# Patient Record
Sex: Female | Born: 1945 | Race: White | Hispanic: No | Smoking: Former smoker
Health system: Southern US, Community
[De-identification: ages and names within clinical notes are randomized; demographics above are authoritative.]

## PROBLEM LIST (undated history)

## (undated) DIAGNOSIS — T4145XA Adverse effect of unspecified anesthetic, initial encounter: Secondary | ICD-10-CM

## (undated) DIAGNOSIS — M1990 Unspecified osteoarthritis, unspecified site: Secondary | ICD-10-CM

## (undated) DIAGNOSIS — F419 Anxiety disorder, unspecified: Secondary | ICD-10-CM

## (undated) DIAGNOSIS — K219 Gastro-esophageal reflux disease without esophagitis: Secondary | ICD-10-CM

## (undated) DIAGNOSIS — T8859XA Other complications of anesthesia, initial encounter: Secondary | ICD-10-CM

## (undated) DIAGNOSIS — E785 Hyperlipidemia, unspecified: Secondary | ICD-10-CM

## (undated) DIAGNOSIS — E039 Hypothyroidism, unspecified: Secondary | ICD-10-CM

## (undated) DIAGNOSIS — C801 Malignant (primary) neoplasm, unspecified: Secondary | ICD-10-CM

## (undated) HISTORY — PX: MASTECTOMY: SHX3

## (undated) HISTORY — PX: APPENDECTOMY: SHX54

## (undated) HISTORY — PX: OTHER SURGICAL HISTORY: SHX169

## (undated) HISTORY — PX: CHOLECYSTECTOMY: SHX55

## (undated) HISTORY — PX: CARDIAC CATHETERIZATION: SHX172

## (undated) HISTORY — PX: ENDOMETRIAL BIOPSY: SHX622

## (undated) HISTORY — PX: NASAL SINUS SURGERY: SHX719

## (undated) HISTORY — PX: EYE SURGERY: SHX253

---

## 1998-01-31 ENCOUNTER — Ambulatory Visit (HOSPITAL_COMMUNITY): Admission: RE | Admit: 1998-01-31 | Discharge: 1998-01-31 | Payer: Self-pay | Admitting: *Deleted

## 1998-03-18 ENCOUNTER — Ambulatory Visit (HOSPITAL_COMMUNITY): Admission: RE | Admit: 1998-03-18 | Discharge: 1998-03-18 | Payer: Self-pay | Admitting: Neurosurgery

## 1998-04-02 ENCOUNTER — Ambulatory Visit (HOSPITAL_COMMUNITY): Admission: RE | Admit: 1998-04-02 | Discharge: 1998-04-02 | Payer: Self-pay | Admitting: Neurosurgery

## 1998-04-16 ENCOUNTER — Ambulatory Visit (HOSPITAL_COMMUNITY): Admission: RE | Admit: 1998-04-16 | Discharge: 1998-04-16 | Payer: Self-pay | Admitting: Neurosurgery

## 1998-06-04 ENCOUNTER — Other Ambulatory Visit: Admission: RE | Admit: 1998-06-04 | Discharge: 1998-06-04 | Payer: Self-pay | Admitting: Oncology

## 2000-06-04 ENCOUNTER — Encounter: Admission: RE | Admit: 2000-06-04 | Discharge: 2000-06-04 | Payer: Self-pay | Admitting: Oncology

## 2000-06-04 ENCOUNTER — Encounter: Payer: Self-pay | Admitting: Oncology

## 2000-06-14 ENCOUNTER — Encounter: Admission: RE | Admit: 2000-06-14 | Discharge: 2000-06-14 | Payer: Self-pay | Admitting: Oncology

## 2000-06-14 ENCOUNTER — Encounter: Payer: Self-pay | Admitting: Oncology

## 2000-12-21 ENCOUNTER — Encounter: Payer: Self-pay | Admitting: Gastroenterology

## 2000-12-21 ENCOUNTER — Ambulatory Visit (HOSPITAL_COMMUNITY): Admission: RE | Admit: 2000-12-21 | Discharge: 2000-12-21 | Payer: Self-pay | Admitting: Gastroenterology

## 2001-06-06 ENCOUNTER — Encounter: Admission: RE | Admit: 2001-06-06 | Discharge: 2001-06-06 | Payer: Self-pay | Admitting: Oncology

## 2001-06-06 ENCOUNTER — Encounter: Payer: Self-pay | Admitting: Oncology

## 2001-06-07 ENCOUNTER — Other Ambulatory Visit: Admission: RE | Admit: 2001-06-07 | Discharge: 2001-06-07 | Payer: Self-pay | Admitting: Oncology

## 2002-06-09 ENCOUNTER — Encounter: Payer: Self-pay | Admitting: Oncology

## 2002-06-09 ENCOUNTER — Encounter: Admission: RE | Admit: 2002-06-09 | Discharge: 2002-06-09 | Payer: Self-pay | Admitting: Oncology

## 2002-06-14 ENCOUNTER — Ambulatory Visit (HOSPITAL_COMMUNITY): Admission: RE | Admit: 2002-06-14 | Discharge: 2002-06-14 | Payer: Self-pay | Admitting: Oncology

## 2002-06-14 ENCOUNTER — Encounter: Payer: Self-pay | Admitting: Oncology

## 2002-10-13 ENCOUNTER — Encounter: Payer: Self-pay | Admitting: Gastroenterology

## 2002-10-13 ENCOUNTER — Ambulatory Visit (HOSPITAL_COMMUNITY): Admission: RE | Admit: 2002-10-13 | Discharge: 2002-10-13 | Payer: Self-pay | Admitting: Gastroenterology

## 2003-02-20 ENCOUNTER — Encounter: Payer: Self-pay | Admitting: Neurosurgery

## 2003-02-20 ENCOUNTER — Ambulatory Visit (HOSPITAL_COMMUNITY): Admission: RE | Admit: 2003-02-20 | Discharge: 2003-02-20 | Payer: Self-pay | Admitting: Neurosurgery

## 2003-06-11 ENCOUNTER — Encounter: Admission: RE | Admit: 2003-06-11 | Discharge: 2003-06-11 | Payer: Self-pay | Admitting: Oncology

## 2003-06-11 ENCOUNTER — Encounter: Payer: Self-pay | Admitting: Oncology

## 2003-06-16 ENCOUNTER — Encounter: Payer: Self-pay | Admitting: Oncology

## 2003-06-16 ENCOUNTER — Ambulatory Visit (HOSPITAL_COMMUNITY): Admission: RE | Admit: 2003-06-16 | Discharge: 2003-06-16 | Payer: Self-pay | Admitting: Oncology

## 2003-06-19 ENCOUNTER — Encounter: Payer: Self-pay | Admitting: Neurosurgery

## 2003-06-19 ENCOUNTER — Ambulatory Visit (HOSPITAL_COMMUNITY): Admission: RE | Admit: 2003-06-19 | Discharge: 2003-06-19 | Payer: Self-pay | Admitting: Neurosurgery

## 2003-06-29 HISTORY — PX: BACK SURGERY: SHX140

## 2003-07-10 ENCOUNTER — Inpatient Hospital Stay (HOSPITAL_COMMUNITY): Admission: RE | Admit: 2003-07-10 | Discharge: 2003-07-11 | Payer: Self-pay | Admitting: Neurosurgery

## 2003-07-10 ENCOUNTER — Encounter: Payer: Self-pay | Admitting: Neurosurgery

## 2003-11-22 ENCOUNTER — Ambulatory Visit (HOSPITAL_COMMUNITY): Admission: RE | Admit: 2003-11-22 | Discharge: 2003-11-22 | Payer: Self-pay | Admitting: General Surgery

## 2003-12-14 ENCOUNTER — Ambulatory Visit (HOSPITAL_COMMUNITY): Admission: RE | Admit: 2003-12-14 | Discharge: 2003-12-14 | Payer: Self-pay | Admitting: General Surgery

## 2004-01-27 HISTORY — PX: DILATION AND CURETTAGE OF UTERUS: SHX78

## 2004-02-14 ENCOUNTER — Encounter (INDEPENDENT_AMBULATORY_CARE_PROVIDER_SITE_OTHER): Payer: Self-pay | Admitting: Specialist

## 2004-02-14 ENCOUNTER — Ambulatory Visit (HOSPITAL_COMMUNITY): Admission: RE | Admit: 2004-02-14 | Discharge: 2004-02-14 | Payer: Self-pay | Admitting: Obstetrics and Gynecology

## 2004-02-14 ENCOUNTER — Ambulatory Visit (HOSPITAL_BASED_OUTPATIENT_CLINIC_OR_DEPARTMENT_OTHER): Admission: RE | Admit: 2004-02-14 | Discharge: 2004-02-14 | Payer: Self-pay | Admitting: Obstetrics and Gynecology

## 2004-06-11 ENCOUNTER — Encounter: Admission: RE | Admit: 2004-06-11 | Discharge: 2004-06-11 | Payer: Self-pay | Admitting: Oncology

## 2004-08-26 ENCOUNTER — Ambulatory Visit: Payer: Self-pay | Admitting: Endocrinology

## 2004-12-22 ENCOUNTER — Ambulatory Visit: Payer: Self-pay | Admitting: Internal Medicine

## 2005-03-09 ENCOUNTER — Ambulatory Visit: Payer: Self-pay | Admitting: Endocrinology

## 2005-03-26 ENCOUNTER — Ambulatory Visit: Payer: Self-pay | Admitting: Endocrinology

## 2005-04-30 ENCOUNTER — Ambulatory Visit (HOSPITAL_COMMUNITY): Admission: RE | Admit: 2005-04-30 | Discharge: 2005-04-30 | Payer: Self-pay | Admitting: General Surgery

## 2005-05-06 ENCOUNTER — Ambulatory Visit: Payer: Self-pay | Admitting: Internal Medicine

## 2005-05-28 ENCOUNTER — Ambulatory Visit: Payer: Self-pay | Admitting: Oncology

## 2005-06-26 ENCOUNTER — Encounter: Admission: RE | Admit: 2005-06-26 | Discharge: 2005-06-26 | Payer: Self-pay | Admitting: Oncology

## 2005-06-28 HISTORY — PX: PARATHYROIDECTOMY: SHX19

## 2005-07-22 ENCOUNTER — Encounter (INDEPENDENT_AMBULATORY_CARE_PROVIDER_SITE_OTHER): Payer: Self-pay | Admitting: Specialist

## 2005-07-22 ENCOUNTER — Ambulatory Visit (HOSPITAL_COMMUNITY): Admission: RE | Admit: 2005-07-22 | Discharge: 2005-07-23 | Payer: Self-pay | Admitting: General Surgery

## 2005-09-02 ENCOUNTER — Ambulatory Visit: Payer: Self-pay | Admitting: Endocrinology

## 2006-04-27 ENCOUNTER — Ambulatory Visit: Payer: Self-pay | Admitting: Endocrinology

## 2006-05-20 ENCOUNTER — Ambulatory Visit: Payer: Self-pay | Admitting: Endocrinology

## 2006-05-28 ENCOUNTER — Ambulatory Visit: Payer: Self-pay | Admitting: Oncology

## 2006-06-02 LAB — COMPREHENSIVE METABOLIC PANEL
ALT: 12 U/L (ref 0–40)
AST: 16 U/L (ref 0–37)
Alkaline Phosphatase: 84 U/L (ref 39–117)
Chloride: 99 mEq/L (ref 96–112)
Creatinine, Ser: 0.87 mg/dL (ref 0.40–1.20)
Total Bilirubin: 0.4 mg/dL (ref 0.3–1.2)

## 2006-06-02 LAB — CBC WITH DIFFERENTIAL/PLATELET
BASO%: 0.4 % (ref 0.0–2.0)
EOS%: 1.2 % (ref 0.0–7.0)
HCT: 41.1 % (ref 34.8–46.6)
LYMPH%: 30.7 % (ref 14.0–48.0)
MCH: 30.9 pg (ref 26.0–34.0)
MCHC: 34.5 g/dL (ref 32.0–36.0)
NEUT%: 60.6 % (ref 39.6–76.8)
lymph#: 2.4 10*3/uL (ref 0.9–3.3)

## 2006-07-02 ENCOUNTER — Encounter: Admission: RE | Admit: 2006-07-02 | Discharge: 2006-07-02 | Payer: Self-pay | Admitting: Oncology

## 2007-06-02 ENCOUNTER — Ambulatory Visit: Payer: Self-pay | Admitting: Oncology

## 2007-06-06 LAB — CBC WITH DIFFERENTIAL/PLATELET
Basophils Absolute: 0 10*3/uL (ref 0.0–0.1)
EOS%: 1.2 % (ref 0.0–7.0)
HCT: 40.7 % (ref 34.8–46.6)
HGB: 14.6 g/dL (ref 11.6–15.9)
MCH: 31.7 pg (ref 26.0–34.0)
MCV: 88.6 fL (ref 81.0–101.0)
MONO%: 4.8 % (ref 0.0–13.0)
NEUT%: 67.9 % (ref 39.6–76.8)

## 2007-06-06 LAB — COMPREHENSIVE METABOLIC PANEL
AST: 16 U/L (ref 0–37)
Alkaline Phosphatase: 83 U/L (ref 39–117)
BUN: 20 mg/dL (ref 6–23)
Calcium: 9.4 mg/dL (ref 8.4–10.5)
Chloride: 100 mEq/L (ref 96–112)
Creatinine, Ser: 0.8 mg/dL (ref 0.40–1.20)
Glucose, Bld: 177 mg/dL — ABNORMAL HIGH (ref 70–99)

## 2007-06-20 ENCOUNTER — Encounter: Payer: Self-pay | Admitting: *Deleted

## 2007-06-20 DIAGNOSIS — F411 Generalized anxiety disorder: Secondary | ICD-10-CM | POA: Insufficient documentation

## 2007-06-20 DIAGNOSIS — J309 Allergic rhinitis, unspecified: Secondary | ICD-10-CM | POA: Insufficient documentation

## 2007-06-20 DIAGNOSIS — Z860100 Personal history of colon polyps, unspecified: Secondary | ICD-10-CM | POA: Insufficient documentation

## 2007-06-20 DIAGNOSIS — Z853 Personal history of malignant neoplasm of breast: Secondary | ICD-10-CM | POA: Insufficient documentation

## 2007-06-20 DIAGNOSIS — K219 Gastro-esophageal reflux disease without esophagitis: Secondary | ICD-10-CM

## 2007-06-20 DIAGNOSIS — M1990 Unspecified osteoarthritis, unspecified site: Secondary | ICD-10-CM | POA: Insufficient documentation

## 2007-07-04 ENCOUNTER — Encounter: Admission: RE | Admit: 2007-07-04 | Discharge: 2007-07-04 | Payer: Self-pay | Admitting: Oncology

## 2008-03-26 ENCOUNTER — Ambulatory Visit: Payer: Self-pay | Admitting: Gastroenterology

## 2008-04-09 ENCOUNTER — Encounter: Payer: Self-pay | Admitting: Gastroenterology

## 2008-04-09 ENCOUNTER — Ambulatory Visit: Payer: Self-pay | Admitting: Gastroenterology

## 2008-04-11 ENCOUNTER — Encounter: Payer: Self-pay | Admitting: Gastroenterology

## 2008-06-22 ENCOUNTER — Ambulatory Visit: Payer: Self-pay | Admitting: Oncology

## 2008-06-26 LAB — CBC WITH DIFFERENTIAL/PLATELET
BASO%: 0.3 % (ref 0.0–2.0)
Basophils Absolute: 0 10e3/uL (ref 0.0–0.1)
EOS%: 1.2 % (ref 0.0–7.0)
Eosinophils Absolute: 0.1 10e3/uL (ref 0.0–0.5)
HCT: 39.8 % (ref 34.8–46.6)
HGB: 13.8 g/dL (ref 11.6–15.9)
LYMPH%: 30.4 % (ref 14.0–48.0)
MCH: 30.5 pg (ref 26.0–34.0)
MCHC: 34.7 g/dL (ref 32.0–36.0)
MCV: 87.9 fL (ref 81.0–101.0)
MONO#: 0.5 10e3/uL (ref 0.1–0.9)
MONO%: 7.7 % (ref 0.0–13.0)
NEUT#: 4.2 10e3/uL (ref 1.5–6.5)
NEUT%: 60.4 % (ref 39.6–76.8)
Platelets: 272 10e3/uL (ref 145–400)
RBC: 4.52 10e6/uL (ref 3.70–5.32)
RDW: 14.3 % (ref 11.3–14.5)
WBC: 6.9 10e3/uL (ref 3.9–10.0)
lymph#: 2.1 10e3/uL (ref 0.9–3.3)

## 2008-06-26 LAB — COMPREHENSIVE METABOLIC PANEL
ALT: 14 U/L (ref 0–35)
CO2: 24 mEq/L (ref 19–32)
Chloride: 102 mEq/L (ref 96–112)
Potassium: 4.2 mEq/L (ref 3.5–5.3)
Sodium: 138 mEq/L (ref 135–145)
Total Bilirubin: 0.4 mg/dL (ref 0.3–1.2)
Total Protein: 7.7 g/dL (ref 6.0–8.3)

## 2008-06-26 LAB — LACTATE DEHYDROGENASE: LDH: 134 U/L (ref 94–250)

## 2008-07-09 ENCOUNTER — Encounter: Admission: RE | Admit: 2008-07-09 | Discharge: 2008-07-09 | Payer: Self-pay | Admitting: Oncology

## 2009-04-04 ENCOUNTER — Telehealth (INDEPENDENT_AMBULATORY_CARE_PROVIDER_SITE_OTHER): Payer: Self-pay | Admitting: *Deleted

## 2009-04-05 ENCOUNTER — Telehealth: Payer: Self-pay | Admitting: Cardiology

## 2009-04-17 ENCOUNTER — Ambulatory Visit: Payer: Self-pay | Admitting: Cardiology

## 2009-04-17 DIAGNOSIS — R9431 Abnormal electrocardiogram [ECG] [EKG]: Secondary | ICD-10-CM

## 2009-04-28 HISTORY — PX: JOINT REPLACEMENT: SHX530

## 2009-05-16 ENCOUNTER — Inpatient Hospital Stay (HOSPITAL_COMMUNITY): Admission: RE | Admit: 2009-05-16 | Discharge: 2009-05-18 | Payer: Self-pay | Admitting: Orthopedic Surgery

## 2009-07-04 ENCOUNTER — Ambulatory Visit: Payer: Self-pay | Admitting: Oncology

## 2009-07-08 LAB — COMPREHENSIVE METABOLIC PANEL
Albumin: 4.4 g/dL (ref 3.5–5.2)
BUN: 15 mg/dL (ref 6–23)
CO2: 25 mEq/L (ref 19–32)
Calcium: 10 mg/dL (ref 8.4–10.5)
Glucose, Bld: 108 mg/dL — ABNORMAL HIGH (ref 70–99)
Potassium: 3.9 mEq/L (ref 3.5–5.3)
Sodium: 138 mEq/L (ref 135–145)
Total Protein: 7.4 g/dL (ref 6.0–8.3)

## 2009-07-08 LAB — CBC WITH DIFFERENTIAL/PLATELET
Basophils Absolute: 0 10*3/uL (ref 0.0–0.1)
Eosinophils Absolute: 0.1 10*3/uL (ref 0.0–0.5)
HGB: 12.6 g/dL (ref 11.6–15.9)
MONO#: 0.6 10*3/uL (ref 0.1–0.9)
NEUT#: 4.7 10*3/uL (ref 1.5–6.5)
Platelets: 381 10*3/uL (ref 145–400)
RBC: 4.49 10*6/uL (ref 3.70–5.45)
RDW: 15.3 % — ABNORMAL HIGH (ref 11.2–14.5)
WBC: 8.1 10*3/uL (ref 3.9–10.3)

## 2009-07-08 LAB — LACTATE DEHYDROGENASE: LDH: 144 U/L (ref 94–250)

## 2009-07-10 ENCOUNTER — Encounter: Admission: RE | Admit: 2009-07-10 | Discharge: 2009-07-10 | Payer: Self-pay | Admitting: Oncology

## 2010-07-03 ENCOUNTER — Ambulatory Visit: Payer: Self-pay | Admitting: Oncology

## 2010-07-07 LAB — COMPREHENSIVE METABOLIC PANEL
ALT: 13 U/L (ref 0–35)
AST: 19 U/L (ref 0–37)
Albumin: 4.2 g/dL (ref 3.5–5.2)
Alkaline Phosphatase: 88 U/L (ref 39–117)
BUN: 17 mg/dL (ref 6–23)
CO2: 26 mEq/L (ref 19–32)
Calcium: 9.6 mg/dL (ref 8.4–10.5)
Chloride: 104 mEq/L (ref 96–112)
Creatinine, Ser: 0.81 mg/dL (ref 0.40–1.20)
Glucose, Bld: 117 mg/dL — ABNORMAL HIGH (ref 70–99)
Potassium: 3.9 mEq/L (ref 3.5–5.3)
Sodium: 139 mEq/L (ref 135–145)
Total Bilirubin: 0.5 mg/dL (ref 0.3–1.2)
Total Protein: 7.1 g/dL (ref 6.0–8.3)

## 2010-07-07 LAB — CBC WITH DIFFERENTIAL/PLATELET
BASO%: 0.3 % (ref 0.0–2.0)
Basophils Absolute: 0 10*3/uL (ref 0.0–0.1)
EOS%: 1.1 % (ref 0.0–7.0)
Eosinophils Absolute: 0.1 10*3/uL (ref 0.0–0.5)
HCT: 37.7 % (ref 34.8–46.6)
HGB: 13.1 g/dL (ref 11.6–15.9)
LYMPH%: 34.7 % (ref 14.0–49.7)
MCH: 30.8 pg (ref 25.1–34.0)
MCHC: 34.7 g/dL (ref 31.5–36.0)
MCV: 88.7 fL (ref 79.5–101.0)
MONO#: 0.5 10*3/uL (ref 0.1–0.9)
MONO%: 7.9 % (ref 0.0–14.0)
NEUT#: 3.6 10*3/uL (ref 1.5–6.5)
NEUT%: 56 % (ref 38.4–76.8)
Platelets: 250 10*3/uL (ref 145–400)
RBC: 4.25 10*6/uL (ref 3.70–5.45)
RDW: 14 % (ref 11.2–14.5)
WBC: 6.4 10*3/uL (ref 3.9–10.3)
lymph#: 2.2 10*3/uL (ref 0.9–3.3)

## 2010-07-07 LAB — LACTATE DEHYDROGENASE: LDH: 136 U/L (ref 94–250)

## 2010-07-11 ENCOUNTER — Encounter: Admission: RE | Admit: 2010-07-11 | Discharge: 2010-07-11 | Payer: Self-pay | Admitting: Oncology

## 2010-10-18 ENCOUNTER — Other Ambulatory Visit: Payer: Self-pay | Admitting: Oncology

## 2010-10-18 DIAGNOSIS — Z0000 Encounter for general adult medical examination without abnormal findings: Secondary | ICD-10-CM

## 2011-01-03 LAB — COMPREHENSIVE METABOLIC PANEL
ALT: 19 U/L (ref 0–35)
AST: 23 U/L (ref 0–37)
Albumin: 3.8 g/dL (ref 3.5–5.2)
Alkaline Phosphatase: 76 U/L (ref 39–117)
CO2: 28 mEq/L (ref 19–32)
Chloride: 103 mEq/L (ref 96–112)
Creatinine, Ser: 0.8 mg/dL (ref 0.4–1.2)
GFR calc Af Amer: >60 mL/min (ref >60)
GFR calc non Af Amer: >60 mL/min (ref >60)
Potassium: 4.4 mEq/L (ref 3.5–5.1)
Sodium: 139 mEq/L (ref 135–145)
Total Bilirubin: 0.6 mg/dL (ref 0.3–1.2)

## 2011-01-03 LAB — URINALYSIS, ROUTINE W REFLEX MICROSCOPIC
Bilirubin Urine: NEGATIVE
Glucose, UA: NEGATIVE mg/dL
Hgb urine dipstick: NEGATIVE
Ketones, ur: NEGATIVE mg/dL
Nitrite: NEGATIVE
Protein, ur: NEGATIVE mg/dL
Specific Gravity, Urine: 1.008 (ref 1.005–1.030)
Urobilinogen, UA: 0.2 mg/dL (ref 0.0–1.0)
pH: 6 (ref 5.0–8.0)

## 2011-01-03 LAB — CBC
HCT: 28 % — ABNORMAL LOW (ref 36.0–46.0)
HCT: 31 % — ABNORMAL LOW (ref 36.0–46.0)
HCT: 36.3 % (ref 36.0–46.0)
Hemoglobin: 10.5 g/dL — ABNORMAL LOW (ref 12.0–15.0)
Hemoglobin: 9.3 g/dL — ABNORMAL LOW (ref 12.0–15.0)
Hemoglobin: 12.1 g/dL (ref 12.0–15.0)
MCHC: 33.4 g/dL (ref 30.0–36.0)
MCHC: 34 g/dL (ref 30.0–36.0)
MCHC: 33.5 g/dL (ref 30.0–36.0)
MCV: 85.4 fL (ref 78.0–100.0)
MCV: 85.9 fL (ref 78.0–100.0)
MCV: 85.3 fL (ref 78.0–100.0)
Platelets: 204 10*3/uL (ref 150–400)
Platelets: 229 10*3/uL (ref 150–400)
Platelets: 282 10*3/uL (ref 150–400)
RBC: 3.26 MIL/uL — ABNORMAL LOW (ref 3.87–5.11)
RBC: 3.63 MIL/uL — ABNORMAL LOW (ref 3.87–5.11)
RBC: 4.25 MIL/uL (ref 3.87–5.11)
RDW: 16.9 % — ABNORMAL HIGH (ref 11.5–15.5)
RDW: 17.1 % — ABNORMAL HIGH (ref 11.5–15.5)
RDW: 17.5 % — ABNORMAL HIGH (ref 11.5–15.5)
WBC: 9.3 10*3/uL (ref 4.0–10.5)
WBC: 9.9 10*3/uL (ref 4.0–10.5)
WBC: 7.3 10*3/uL (ref 4.0–10.5)

## 2011-01-03 LAB — DIFFERENTIAL
Basophils Absolute: 0 10*3/uL (ref 0.0–0.1)
Basophils Relative: 1 % (ref 0–1)
Eosinophils Absolute: 0.1 10*3/uL (ref 0.0–0.7)
Eosinophils Relative: 2 % (ref 0–5)
Lymphocytes Relative: 34 % (ref 12–46)
Monocytes Absolute: 0.6 10*3/uL (ref 0.1–1.0)

## 2011-01-03 LAB — CROSSMATCH: ABO/RH(D): O POS

## 2011-01-03 LAB — BASIC METABOLIC PANEL
BUN: 9 mg/dL (ref 6–23)
BUN: 9 mg/dL (ref 6–23)
CO2: 27 mEq/L (ref 19–32)
CO2: 31 mEq/L (ref 19–32)
Calcium: 8.1 mg/dL — ABNORMAL LOW (ref 8.4–10.5)
Calcium: 8.8 mg/dL (ref 8.4–10.5)
Chloride: 103 mEq/L (ref 96–112)
Chloride: 106 mEq/L (ref 96–112)
Creatinine, Ser: 0.73 mg/dL (ref 0.4–1.2)
Creatinine, Ser: 0.92 mg/dL (ref 0.4–1.2)
GFR calc Af Amer: >60 mL/min (ref >60)
GFR calc Af Amer: >60 mL/min (ref >60)
GFR calc non Af Amer: >60 mL/min (ref >60)
GFR calc non Af Amer: >60 mL/min (ref >60)
Glucose, Bld: 111 mg/dL — ABNORMAL HIGH (ref 70–99)
Glucose, Bld: 135 mg/dL — ABNORMAL HIGH (ref 70–99)
Potassium: 3.8 mEq/L (ref 3.5–5.1)
Potassium: 4.4 mEq/L (ref 3.5–5.1)
Sodium: 135 mEq/L (ref 135–145)
Sodium: 141 mEq/L (ref 135–145)

## 2011-01-03 LAB — PROTIME-INR: Prothrombin Time: 12.6 seconds (ref 11.6–15.2)

## 2011-01-03 LAB — URINE MICROSCOPIC-ADD ON

## 2011-02-10 NOTE — Op Note (Signed)
Megan Burgess, Megan Burgess                ACCOUNT NO.:  [id number]   MEDICAL RECORD NO.:  [id number]          PATIENT TYPE:  INP   LOCATION:  0010                         FACILITY:  [facility]   PHYSICIAN:  John L. Rendall, M.D.  DATE OF BIRTH:  04-24-1946   DATE OF PROCEDURE:  05/16/2009  DATE OF DISCHARGE:                               OPERATIVE REPORT   PREOPERATIVE DIAGNOSIS:  Osteoarthritis, right knee.   SURGICAL PROCEDURES:  Right total knee arthroplasty with computer  navigation assistance.   POSTOPERATIVE DIAGNOSIS:  Osteoarthritis, right knee.   SURGEON:  Erasmo Leventhal, M.D.   ASSISTANTAlisa Graff, PA-C present and participating in the entire  procedure.   ANESTHESIA:  General with femoral nerve block.   PATHOLOGY:  The patient has had osteoarthritis of the knee for years.  She has bare bone down the center of the medial femoral condyle  weightbearing area.  She has spurs along the medial and lateral femoral  condyles and chronic pain in the knee, worse with ambulation and  resistant to conservative measures.   DESCRIPTION OF PROCEDURE:  Under general anesthesia the right leg was  prepared with DuraPrep and draped as a sterile field.  A sterile  tourniquet is used proximally, leg is wrapped out with the Esmarch and  the tourniquet is elevated to 350 mm.  Midline incision is made.  The  patella is everted.  The femoral condyle is estimated to be a standard  plus.  Debridement is done in preparation for computer mapping.  The  computer mapping is then done placing two Schanz pins in the superior  medial tibia through accessory punctures and in the distal medial femur.  The arrays were set up.  The femoral head is identified.  Medial and  lateral malleoli are identified.  Proximal tibia is mapped within 0.5 mm  of reproducibility.  The femur is then mapped within about the same  limits of reproducibility.  With mapping complete, the proximal tibia is  resected using the tibial guide  from the computer removing 9.5 mm.  Once  this is done, the lamina spreader is inserted and ligaments were  balanced.  They are within 1 mm of anatomic alignment from hip to ankle.  The flexion gap is then measured.  Using this, the anterior and  posterior flare of the distal femur are resected with the next  navigation guide.  The distal femoral cut is then made.  Flexion and  extension gaps are balanced nicely at 15 mm.  Lamina spreader is then  inserted and debridement of remnants of the menisci and the cruciate  ligaments and spurs off the back of the femoral condyles is done.  Once  this is completed, the recessing guide is used.  With the femur thus  prepared, attention is turned to the tibia.  It is sized at a #3.  Center peg hole with keel is made.  Trial reduction of a #3 tibia, 15  bearing and standard plus femur reveals excellent fit, alignment and  stability within 1 degree of anatomic alignment and being very close to  full anatomic extension.  The patella is then osteotomized and the three  peg trial is inserted with the incision closed with towel clips.  The  leg was very nicely stable to varus, valgus, extension and the  inflection.  The drawer test was about 1 cm.  Attention was then turned  to permanent components which were obtained.  Bony surfaces prepared  with pulse irrigation.  Synovectomy carried out.  Permanent components  were then cemented in place and the tourniquet was  finally let down at 1 hour.  Multiple small vessels cauterized.  A  medium Hemovac drain was inserted.  The knee was then closed in layers  with #1 Tycron, #1 Vicryl, 2-0 Vicryl and skin clips.  Operative time  approximately 1 hour and 15 minutes.  The patient returned to recovery  in good condition.      John L. Rendall, M.D.  Electronically Signed     JLR/MEDQ  D:  05/16/2009  T:  05/16/2009  Job:  [id number]

## 2011-02-13 NOTE — Op Note (Signed)
NAME:  Megan Burgess, Megan Burgess                          ACCOUNT NO.:  [id number]   MEDICAL RECORD NO.:  [id number]                   PATIENT TYPE:  AMB   LOCATION:  NESC                                 FACILITY:  [facility]   PHYSICIAN:  Katherine Roan, M.D.               DATE OF BIRTH:  1946/03/11   DATE OF PROCEDURE:  02/14/2004  DATE OF DISCHARGE:                                 OPERATIVE REPORT   PREOPERATIVE DIAGNOSES:  Persistent postmenopausal bleeding.   POSTOPERATIVE DIAGNOSES:  Persistent postmenopausal bleeding.  Endometrial  polyp.   PROCEDURE:  Pelvic examination under anesthesia, cervical dilatation,  hysteroscope with resection of endometrial polyp and endometrial resection.   DESCRIPTION OF PROCEDURE:  [name] was placed under general anesthesia  with endotracheal anesthesia.  Examination revealed an anterior normal size  uterus without masses.  The patient was then prepped and draped in the usual  fashion, cervix carefully dilated and the hysteroscope was inserted into the  uterus. There was a small sessile flat polyp on the right lateral uterine  wall which was resected.  No unusual blood loss occurred at the time of  resection.  I did a gentle endometrial resection, all of the tissue was sent  to the lab for study. It was removed with Randall stone forceps and  curettage. I then injected 20 mL of Pitressin solution.  [name]  tolerated the procedure well and was sent to the recovery room in good  condition.                                               Tomi Bamberger, P.A.    SDM/MEDQ  D:  02/14/2004  T:  02/14/2004  Job:  [id number]   cc:   Genene Churn. Cyndie Chime, M.D.  [address] [facility]  [place]  [place] [address]  Fax: [phone]

## 2011-02-13 NOTE — Op Note (Signed)
NAME:  Megan, Burgess                          ACCOUNT NO.:  [id number]   MEDICAL RECORD NO.:  [id number]                   PATIENT TYPE:  INP   LOCATION:  3013                                 FACILITY:  [facility]   PHYSICIAN:  Danae Orleans. Venetia Maxon, M.D.               DATE OF BIRTH:  [birth year]   DATE OF PROCEDURE:  07/10/2003  DATE OF DISCHARGE:                                 OPERATIVE REPORT   PREOPERATIVE DIAGNOSIS:  Chronic intractable low back pain with bilateral  lower extremity pain, status post successful percutaneous spinal cord  stimulator trial.   POSTOPERATIVE DIAGNOSIS:  Chronic intractable low back pain with bilateral  lower extremity pain, status post successful percutaneous spinal cord  stimulator trial.   OPERATION PERFORMED:  Thoracic laminectomy with placement of permanent  [employer]tronic spinal cord stimulator and implantable pulse generator.   SURGEON:  Danae Orleans. Venetia Maxon, M.D.   ANESTHESIA:  General endotracheal.   ESTIMATED BLOOD LOSS:  Minimal.   COMPLICATIONS:  None.   DISPOSITION:  Recovery.   INDICATIONS FOR PROCEDURE:  Megan Burgess is a 65 year old woman with chronic  intractable low back and bilateral lower extremity pain.  This was felt to  be not surgically correctable as she has multilevel degenerative disk  disease throughout each level of the lumbar spine.  It was elected to take  her to surgery for placement of percutaneous spinal cord stimulator and this  was done on a trial basis.  The patient felt that she had excellent pain  relief.  She was therefore brought back to surgery for placement of  permanent implantable spinal cord stimulator.   DESCRIPTION OF PROCEDURE:  The patient was brought to the operating room.  Following satisfactory and uncomplicated induction of general endotracheal  anesthesia and placement of intravenous lines, the patient was placed in a  prone position on the operating table.  Using intraoperative fluoroscopy,  the 10th  rib was identified and the T10-11 interspace was identified.  Subsequently the posterior thoracic spine and posterior iliac crest region  were prepped and draped in the usual sterile fashion.  Incision was made in  the midline after infiltrating the area of planned incision with 0.25%  Marcaine and 0.5% lidocaine with 1:200,000 epinephrine and this was carried  through the subcutaneous tissue.  A subcutaneous pocket was made overlying  the fascia.  Then the fascia was incised on the right side of midline and  the T10-11 interspace was identified.  Self-retaining retractor was placed.  This correct level was confirmed using intraoperative fluoroscopy.  Using  the high speed drill and AM8 equivalent bur, a hemisemilaminectomy of T10  was then performed on the right side of midline.  The ligamentum flavum was  detached and removed in piecemeal fashion.  The superior aspect of T11 was  also decompressed.  Subsequently, the hinged paddle lead was then inserted  and set up to the T8-9  interspace and intraoperative fluoroscopy  demonstrated  the lead was well positioned in the midline.  Subsequently,  the fascia was closed.  The anchoring devices were placed and anchored with  2-0 silk sutures and again the position of the lead was confirmed to ensure  that the lead had not migrated.  The subcutaneous pocket was made overlying  in the right superior posterior gluteal region and the tunneler was placed  and lead extensions were placed.  These were then connected and polyethylene  caps were placed over them and then the redundant lead was circularized and  placed in the subcutaneous pocket.  The connections were then created to the  implantable pulse generator which was inserted into the pocket.  All wounds  were then irrigated with bacitracin saline and closed with interrupted 2-0  and 3-0 Vicryl sutures.  The skin was dressed with benzoin and Steri-Strips,  Telfa gauze and tape.  The patient was  extubated in the operating room and  taken to the recovery room in stable and satisfactory condition having  tolerated the operation well.  Counts were correct at the end of the case.                                               Danae Orleans. Venetia Maxon, M.D.    JDS/MEDQ  D:  07/10/2003  T:  07/10/2003  Job:  [id number]

## 2011-02-13 NOTE — Op Note (Signed)
NAME:  Megan Burgess, Megan Burgess                          ACCOUNT NO.:  [id number]   MEDICAL RECORD NO.:  [id number]                   PATIENT TYPE:  OIB   LOCATION:  3172                                 FACILITY:  [facility]   PHYSICIAN:  Danae Orleans. Venetia Maxon, M.D.               DATE OF BIRTH:  03-05-1946   DATE OF PROCEDURE:  06/19/2003  DATE OF DISCHARGE:  06/19/2003                                 OPERATIVE REPORT   PREOPERATIVE DIAGNOSES:  Chronic intractable back and bilateral lower  extremity pain with degenerative disk disease, lumbar spondylosis, and  lumbar radiculopathy.   POSTOPERATIVE DIAGNOSES:  Chronic intractable back and bilateral lower  extremity pain with degenerative disk disease, lumbar spondylosis, and  lumbar radiculopathy.   PROCEDURE:  Percutaneous placement of spinal cord stimulator under  fluoroscopic guidance.   SURGEON:  Danae Orleans. Venetia Maxon, M.D.   ANESTHESIA:  MAC with local lidocaine.   COMPLICATIONS:  None.   DISPOSITION:  To recovery.   INDICATIONS:  Megan Burgess is a 65 year old woman with chronic intractable  low back pain.  She had previously undergone a laminectomy by Dr. Roxan Hockey  many years ago and recent imaging studies demonstrate degenerative changes  throughout her lumbar spine, L1 to S1.  It was felt that this was not  surgically correctable, and it was elected to trial a spinal cord stimulator  for pain relief.   DESCRIPTION OF PROCEDURE:  [name] was brought to the operating room.  She was given intravenous sedation, placed in a prone position on the  operating table.  She remained awake through the procedure.  Her low back  was then prepped and draped in the usual sterile fashion.  Using  fluoroscopic guidance and local lidocaine for anesthesia, 14-gauge Tuohy  needle was inserted in a paramedian orientation at the level of the L3  pedicle and entered at the L2-3 interspace epidurally.  This was confirmed  using a loss of resistance technique  with AP and lateral fluoroscopic  images.  The stimulating electrode was then inserted through the Tuohy  needle and fed without difficulty in the epidural space to the level of T8-  9, and it was maintained in the midline and confirmed to be in the dorsal  orientation on lateral fluoroscopy.  Prior to removing the Tuohy needle,  trial of the electrode demonstrated good relief of both lower extremity pain  and numbness and the patient appeared to get significant relief of her  discomfort with this device.  Subsequently the electrode was anchored to the  skin with 2-0 silk stitches and a sterile occlusive dressing was placed.  This was anchored after the Tuohy needle was removed.  The patient was taken  to recovery in stable and satisfactory condition, having tolerated the  procedure without difficulty.  Danae Orleans. Venetia Maxon, M.D.    JDS/MEDQ  D:  06/19/2003  T:  06/20/2003  Job:  [id number]

## 2011-02-13 NOTE — Op Note (Signed)
[name], [name]                ACCOUNT NO.:  [id number]   MEDICAL RECORD NO.:  [id number]          PATIENT TYPE:  OIB   LOCATION:  5727                         FACILITY:  [facility]   PHYSICIAN:  Sharlet Salina T. Hoxworth, M.D.DATE OF BIRTH:  01-Dec-1945   DATE OF PROCEDURE:  07/22/2005  DATE OF DISCHARGE:                                 OPERATIVE REPORT   PREOPERATIVE DIAGNOSIS:  Parathyroid adenoma.   POSTOPERATIVE DIAGNOSIS:  Parathyroid adenoma.   SURGICAL PROCEDURES:  1.  Attempted minimally invasive parathyroidectomy.  2.  Full neck exploration and parathyroidectomy.   SURGEON:  Lorne Skeens. Hoxworth, M.D.   ASSISTANT:  Clovis Pu. Cornett, M.D.   ANESTHESIA:  General.   BRIEF HISTORY:  Megan Burgess is a 65 year old female with a history of  progressive hypercalcemia and elevated PTH.  She has had a previous negative sestamibi scan one year ago and normal  calcium excretion.  Recent follow-up, however, has revealed increasing  calcium to 11.8, PTH increased to 138, abnormally high calcium excretion,  and a positive sestamibi scan suspicious for an adenoma in the right  midthyroid area.  With these findings I have recommended proceeding with minimally invasive  parathyroidectomy.  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  The patient is now brought to the  operating room for this procedure.   DESCRIPTION OF OPERATION:  Following administration of a sestamibi injection  in nuclear medicine 90 minutes preop, the patient was taken to the operating  room and placed in supine position on the operating table and general  orotracheal anesthesia was induced.  She was carefully positioned with her  neck extended and the neck widely sterilely prepped and draped.  Initially I  made a small  incision in the skin fold, a curvilinear two fingerbreadths  above the sternal notch transversely on the right side of the neck.  Dissection was carried down through subcutaneous tissue and platysma using  the cautery.  The midline strap muscles were identified and midline was  divided and the strap muscles on the right retracted laterally.  Using  careful blunt cautery dissection, the right lobe of the thyroid was exposed.  The thyroid gland itself was very small.  The probe was used to scan over  the thyroid gland, and there were no really impressive hot spots with the  hottest area being directly over the thyroid gland itself at about 400.  Through this small incision the right side was explored as completely as  possible, mobilizing the thyroid gland medially.  The upper and lower pole  of the thyroid were identified as well as the inferior thyroid artery.  This area was dissected fairly thoroughly as much as possible through the  small incision and multiple areas were scanned with the NeoProbe, which did  not reveal any areas of increased activity of the thyroid gland itself.  I  was not able to identify clearly any parathyroid tissue on the right side  through this small incision.  After we felt we had explored this area as  much as possible through the minimal incision without success, the excision  was extended over to the left side for full exploration.  Again, on the left  side the platysma muscle was divided, the strap muscles were further divided  then superiorly and inferiorly after raising subplatysmal flaps and the  corner retractor replaced.  The left lobe of the thyroid was exposed with  careful blunt dissection and was also dissected and mobilized.  I did  identify a normal-appearing inferior parathyroid gland on the left.  There  was no increased activity other then again in the thyroid gland using the  probe.  With the larger incision, attention was then turned back to  the  right lobe, which could be further explored and mobilized more extensively  through the larger incision and dissecting more posteriorly around the  inferior thyroid artery, we did come down onto a typical-appearing  parathyroid adenoma, not extremely large but definitely abnormal size,  measuring approximately 8 mm in diameter.  Staying on the capsule of the  adenoma, this was then dissected free and the vascular pedicle, which was  controlled with clips on this gland, was removed.  Interestingly, ex vivo  the adenoma had counts of only about 180 with the counts over the thyroid  lobe itself again being around 380.  However, the thyroid lobe was very  small and easily palpated and fully mobilized, and there does not appear to  be any intrathyroidal mass.  Grossly this clearly appeared to be a  parathyroid adenoma.  This was sent for frozen section, which confirmed  parathyroid tissue consistent with adenoma.  At this point I felt that the  adenoma had been found and removed.  Hemostasis was assured in the operative  site.  The strap muscles were then closed in the midline with interrupted 3-  0 Vicryl.  The platysma was closed with 3-0 Vicryl and the skin closed with  staples and Steri-Strips.  Sponge, needle and instrument counts were  correct.  Dry sterile dressings were applied.  The patient taken to recovery  in good condition.      Lorne Skeens. Hoxworth, M.D.  Electronically Signed     BTH/MEDQ  D:  07/22/2005  T:  07/22/2005  Job:  [id number]

## 2011-02-13 NOTE — Letter (Signed)
June 18, 2006     Megan Burgess  [address]  [place], [place]  [address]   RE:  Megan Burgess, Megan Burgess  MRN:  [id number]  /  DOB:  1946-06-09   Dear. Megan Burgess:   I have received your letter of May 28, 2006.  Your letter raises two  points which I shall address separately.   First, it is my responsibility under the law to give you sound and correct  medical information.  Therefore, I have to advise you that the rash you  suffered from Levothyroxine was a coincidence, and not due to the fact that  you were taking generic medication.  You are free to purchase the brand name  medication, but I cannot honestly certify your medical need for the brand  name.   The other matter concerns your failure to keep your scheduled appointment.  When a patient does not keep an appointment, this is a cost to [place] which must  then be born by our other customers.  They inform me consistently that they  should not and will not bear this cost.  Thus is the policy of a charge for  a no-show.  While I understand we all have busy lives, this charge remains  your responsibility.   I wish you all the best with your new physician, and I hope your husband is  feeling better as well.   Sincerely,     Sean A. Everardo All, MD   SAE/MedQ  DD:  06/18/2006  DT:  06/21/2006  Job #:  [id number]

## 2011-03-02 ENCOUNTER — Other Ambulatory Visit: Payer: Self-pay | Admitting: Gynecology

## 2011-05-19 ENCOUNTER — Encounter: Payer: Self-pay | Admitting: Gastroenterology

## 2011-07-13 ENCOUNTER — Other Ambulatory Visit: Payer: Self-pay | Admitting: Oncology

## 2011-07-13 ENCOUNTER — Encounter (HOSPITAL_BASED_OUTPATIENT_CLINIC_OR_DEPARTMENT_OTHER): Payer: Medicare Other | Admitting: Oncology

## 2011-07-13 ENCOUNTER — Ambulatory Visit
Admission: RE | Admit: 2011-07-13 | Discharge: 2011-07-13 | Disposition: A | Discharge status: Home / Self Care | Payer: Medicare Other | Source: Ambulatory Visit | Attending: Oncology | Admitting: Oncology

## 2011-07-13 DIAGNOSIS — Z0000 Encounter for general adult medical examination without abnormal findings: Secondary | ICD-10-CM

## 2011-07-13 DIAGNOSIS — Z853 Personal history of malignant neoplasm of breast: Secondary | ICD-10-CM

## 2011-07-13 LAB — CBC WITH DIFFERENTIAL/PLATELET
Basophils Absolute: 0.1 10*3/uL (ref 0.0–0.1)
Eosinophils Absolute: 0.1 10*3/uL (ref 0.0–0.5)
HGB: 15.1 g/dL (ref 11.6–15.9)
MCV: 89.9 fL (ref 79.5–101.0)
MONO#: 0.6 10*3/uL (ref 0.1–0.9)
MONO%: 6.8 % (ref 0.0–14.0)
NEUT#: 5.2 10*3/uL (ref 1.5–6.5)
RDW: 13.1 % (ref 11.2–14.5)
WBC: 8.8 10*3/uL (ref 3.9–10.3)

## 2011-07-13 LAB — COMPREHENSIVE METABOLIC PANEL
Albumin: 4.4 g/dL (ref 3.5–5.2)
Alkaline Phosphatase: 73 U/L (ref 39–117)
BUN: 21 mg/dL (ref 6–23)
CO2: 25 mEq/L (ref 19–32)
Calcium: 10.1 mg/dL (ref 8.4–10.5)
Chloride: 100 mEq/L (ref 96–112)
Glucose, Bld: 91 mg/dL (ref 70–99)
Potassium: 4.2 mEq/L (ref 3.5–5.3)
Total Protein: 7.5 g/dL (ref 6.0–8.3)

## 2011-07-13 LAB — LACTATE DEHYDROGENASE: LDH: 128 U/L (ref 94–250)

## 2011-07-21 ENCOUNTER — Other Ambulatory Visit: Payer: Self-pay | Admitting: Oncology

## 2011-07-21 ENCOUNTER — Encounter (HOSPITAL_BASED_OUTPATIENT_CLINIC_OR_DEPARTMENT_OTHER): Payer: Medicare Other | Admitting: Oncology

## 2011-07-21 DIAGNOSIS — Z9012 Acquired absence of left breast and nipple: Secondary | ICD-10-CM

## 2011-07-21 DIAGNOSIS — Z9010 Acquired absence of unspecified breast and nipple: Secondary | ICD-10-CM

## 2011-07-21 DIAGNOSIS — Z853 Personal history of malignant neoplasm of breast: Secondary | ICD-10-CM

## 2011-07-21 DIAGNOSIS — Z1231 Encounter for screening mammogram for malignant neoplasm of breast: Secondary | ICD-10-CM

## 2011-08-27 ENCOUNTER — Other Ambulatory Visit: Payer: Self-pay | Admitting: Oncology

## 2012-04-06 ENCOUNTER — Other Ambulatory Visit: Payer: Self-pay | Admitting: Gynecology

## 2012-04-21 ENCOUNTER — Other Ambulatory Visit: Payer: Self-pay | Admitting: Gynecology

## 2012-05-02 ENCOUNTER — Encounter (HOSPITAL_BASED_OUTPATIENT_CLINIC_OR_DEPARTMENT_OTHER): Payer: Self-pay | Admitting: *Deleted

## 2012-05-02 NOTE — Progress Notes (Signed)
Pt instructed npo p mn 8/11 x zantac, xanax, synthroid, simvastatin w sip of water.  To [facility] 8/12 @ 0700.  Needs ekg, istat on arrival

## 2012-05-09 ENCOUNTER — Encounter (HOSPITAL_BASED_OUTPATIENT_CLINIC_OR_DEPARTMENT_OTHER)
Admission: RE | Disposition: A | Discharge status: Home / Self Care | Payer: Self-pay | Source: Ambulatory Visit | Attending: Gynecology

## 2012-05-09 ENCOUNTER — Encounter (HOSPITAL_BASED_OUTPATIENT_CLINIC_OR_DEPARTMENT_OTHER): Payer: Self-pay

## 2012-05-09 ENCOUNTER — Encounter (HOSPITAL_BASED_OUTPATIENT_CLINIC_OR_DEPARTMENT_OTHER): Payer: Self-pay | Admitting: Anesthesiology

## 2012-05-09 ENCOUNTER — Ambulatory Visit (HOSPITAL_BASED_OUTPATIENT_CLINIC_OR_DEPARTMENT_OTHER)
Admission: RE | Admit: 2012-05-09 | Discharge: 2012-05-09 | Disposition: A | Discharge status: Home / Self Care | Payer: Medicare Other | Source: Ambulatory Visit | Attending: Gynecology | Admitting: Gynecology

## 2012-05-09 ENCOUNTER — Other Ambulatory Visit: Payer: Self-pay

## 2012-05-09 ENCOUNTER — Ambulatory Visit (HOSPITAL_BASED_OUTPATIENT_CLINIC_OR_DEPARTMENT_OTHER): Payer: Medicare Other | Admitting: Anesthesiology

## 2012-05-09 DIAGNOSIS — N950 Postmenopausal bleeding: Secondary | ICD-10-CM | POA: Insufficient documentation

## 2012-05-09 DIAGNOSIS — N840 Polyp of corpus uteri: Secondary | ICD-10-CM | POA: Insufficient documentation

## 2012-05-09 HISTORY — DX: Gastro-esophageal reflux disease without esophagitis: K21.9

## 2012-05-09 HISTORY — DX: Anxiety disorder, unspecified: F41.9

## 2012-05-09 HISTORY — DX: Hyperlipidemia, unspecified: E78.5

## 2012-05-09 HISTORY — DX: Hypothyroidism, unspecified: E03.9

## 2012-05-09 HISTORY — PX: HYSTEROSCOPY W/D&C: SHX1775

## 2012-05-09 HISTORY — DX: Adverse effect of unspecified anesthetic, initial encounter: T41.45XA

## 2012-05-09 HISTORY — DX: Other complications of anesthesia, initial encounter: T88.59XA

## 2012-05-09 LAB — POCT I-STAT 4, (NA,K, GLUC, HGB,HCT)
Glucose, Bld: 91 mg/dL (ref 70–99)
HCT: 46 % (ref 36.0–46.0)
Hemoglobin: 15.6 g/dL — ABNORMAL HIGH (ref 12.0–15.0)

## 2012-05-09 SURGERY — DILATATION AND CURETTAGE /HYSTEROSCOPY
Anesthesia: Monitor Anesthesia Care | Site: Uterus | Wound class: Clean Contaminated

## 2012-05-09 MED ORDER — LIDOCAINE HCL (PF) 1 % IJ SOLN
INTRAMUSCULAR | Status: DC | PRN
  Administered 2012-05-09: 14 mL

## 2012-05-09 MED ORDER — MIDAZOLAM HCL 5 MG/5ML IJ SOLN
INTRAMUSCULAR | Status: DC | PRN
  Administered 2012-05-09: 2 mg via INTRAVENOUS

## 2012-05-09 MED ORDER — ONDANSETRON HCL 4 MG/2ML IJ SOLN
INTRAMUSCULAR | Status: DC | PRN
  Administered 2012-05-09: 4 mg via INTRAVENOUS

## 2012-05-09 MED ORDER — FENTANYL CITRATE 0.05 MG/ML IJ SOLN
INTRAMUSCULAR | Status: DC | PRN
  Administered 2012-05-09 (×2): 50 ug via INTRAVENOUS

## 2012-05-09 MED ORDER — PROMETHAZINE HCL 25 MG/ML IJ SOLN: 6.2500 mg | INTRAMUSCULAR | Status: DC | PRN

## 2012-05-09 MED ORDER — LACTATED RINGERS IV SOLN
INTRAVENOUS | Status: DC
  Administered 2012-05-09: 08:00:00 via INTRAVENOUS

## 2012-05-09 MED ORDER — FENTANYL CITRATE 0.05 MG/ML IJ SOLN: 25.0000 ug | INTRAMUSCULAR | Status: DC | PRN

## 2012-05-09 MED ORDER — KETOROLAC TROMETHAMINE 30 MG/ML IJ SOLN
INTRAMUSCULAR | Status: DC | PRN
  Administered 2012-05-09: 15 mg via INTRAVENOUS

## 2012-05-09 MED ORDER — PROPOFOL 10 MG/ML IV EMUL
INTRAVENOUS | Status: DC | PRN
  Administered 2012-05-09: 50 ug/kg/min via INTRAVENOUS

## 2012-05-09 MED ORDER — DEXAMETHASONE SODIUM PHOSPHATE 4 MG/ML IJ SOLN
INTRAMUSCULAR | Status: DC | PRN
  Administered 2012-05-09: 10 mg via INTRAVENOUS

## 2012-05-09 MED ORDER — GLYCINE 1.5 % IR SOLN
Status: DC | PRN
  Administered 2012-05-09: 2

## 2012-05-09 MED ORDER — LIDOCAINE HCL (CARDIAC) 20 MG/ML IV SOLN
INTRAVENOUS | Status: DC | PRN
  Administered 2012-05-09: 50 mg via INTRAVENOUS

## 2012-05-09 SURGICAL SUPPLY — 27 items
CANISTER SUCTION 2500CC (MISCELLANEOUS) ×2 IMPLANT
CLOTH BEACON ORANGE TIMEOUT ST (SAFETY) ×2 IMPLANT
CORD ACTIVE DISPOSABLE (ELECTRODE) ×1
CORD ELECTRO ACTIVE DISP (ELECTRODE) ×1 IMPLANT
COVER TABLE BACK 60X90 (DRAPES) ×2 IMPLANT
DRAPE CAMERA CLOSED 9X96 (DRAPES) ×2 IMPLANT
DRAPE LG THREE QUARTER DISP (DRAPES) ×2 IMPLANT
ELECT LOOP GYNE PRO 24FR (CUTTING LOOP) ×2
ELECT REM PT RETURN 9FT ADLT (ELECTROSURGICAL) ×2
ELECT VAPORTRODE GRVD BAR (ELECTRODE) IMPLANT
ELECTRODE LOOP GYNE PRO 24FR (CUTTING LOOP) ×1 IMPLANT
ELECTRODE REM PT RTRN 9FT ADLT (ELECTROSURGICAL) ×1 IMPLANT
GLOVE BIO SURGEON STRL SZ8 (GLOVE) ×4 IMPLANT
GLOVE BIOGEL M 7.0 STRL (GLOVE) ×2 IMPLANT
GLOVE INDICATOR 7.0 STRL GRN (GLOVE) ×2 IMPLANT
GOWN STRL NON-REIN LRG LVL3 (GOWN DISPOSABLE) ×2 IMPLANT
GOWN STRL REIN XL XLG (GOWN DISPOSABLE) ×2 IMPLANT
LEGGING LITHOTOMY PAIR STRL (DRAPES) ×2 IMPLANT
NEEDLE SPNL 22GX3.5 QUINCKE BK (NEEDLE) ×2 IMPLANT
PACK BASIN DAY SURGERY FS (CUSTOM PROCEDURE TRAY) ×2 IMPLANT
PAD OB MATERNITY 4.3X12.25 (PERSONAL CARE ITEMS) ×2 IMPLANT
PAD PREP 24X48 CUFFED NSTRL (MISCELLANEOUS) ×2 IMPLANT
SYR CONTROL 10ML LL (SYRINGE) ×2 IMPLANT
TOWEL OR 17X24 6PK STRL BLUE (TOWEL DISPOSABLE) ×2 IMPLANT
TRAY DSU PREP LF (CUSTOM PROCEDURE TRAY) ×2 IMPLANT
TUBING HYDROFLEX HYSTEROSCOPY (TUBING) ×2 IMPLANT
WATER STERILE IRR 500ML POUR (IV SOLUTION) ×2 IMPLANT

## 2012-05-09 NOTE — Anesthesia Postprocedure Evaluation (Signed)
  Anesthesia Post-op Note  Patient: Megan Burgess  Procedure(s) Performed: Procedure(s) (LRB): DILATATION AND CURETTAGE /HYSTEROSCOPY (N/A)  Patient Location: PACU  Anesthesia Type: MAC  Level of Consciousness: awake and alert   Airway and Oxygen Therapy: Patient Spontanous Breathing  Post-op Pain: mild  Post-op Assessment: Post-op Vital signs reviewed, Patient's Cardiovascular Status Stable, Respiratory Function Stable, Patent Airway and No signs of Nausea or vomiting  Post-op Vital Signs: stable  Complications: No apparent anesthesia complications

## 2012-05-09 NOTE — Transfer of Care (Signed)
Immediate Anesthesia Transfer of Care Note  Patient: Megan Burgess  Procedure(s) Performed: Procedure(s) (LRB): DILATATION AND CURETTAGE /HYSTEROSCOPY (N/A)  Patient Location: PACU  Anesthesia Type: General  Level of Consciousness: awake, alert  and oriented  Airway & Oxygen Therapy: Patient Spontanous Breathing and Patient connected to face mask  Post-op Assessment: Report given to PACU RN  Post vital signs: Reviewed and stable  Complications: No apparent anesthesia complications

## 2012-05-09 NOTE — Anesthesia Preprocedure Evaluation (Addendum)
Anesthesia Evaluation  Patient identified by MRN, date of birth, ID band Patient awake    Reviewed: Allergy & Precautions, H&P , NPO status , Patient's Chart, lab work & pertinent test results  Airway Mallampati: II TM Distance: >3 FB Neck ROM: Full    Dental No notable dental hx. (+) Partial Upper   Pulmonary neg pulmonary ROS,  breath sounds clear to auscultation  Pulmonary exam normal       Cardiovascular negative cardio ROS  Rhythm:Regular Rate:Normal     Neuro/Psych Anxiety Spinal neurostimulator in place and turned off. negative psych ROS   GI/Hepatic Neg liver ROS, GERD-  Medicated,  Endo/Other  Hypothyroidism   Renal/GU negative Renal ROS  negative genitourinary   Musculoskeletal negative musculoskeletal ROS (+)   Abdominal (+) + obese,   Peds negative pediatric ROS (+)  Hematology negative hematology ROS (+)   Anesthesia Other Findings   Reproductive/Obstetrics negative OB ROS                         Anesthesia Physical Anesthesia Plan  ASA: II  Anesthesia Plan: MAC   Post-op Pain Management:    Induction: Intravenous  Airway Management Planned:   Additional Equipment:   Intra-op Plan:   Post-operative Plan: Extubation in OR  Informed Consent: I have reviewed the patients History and Physical, chart, labs and discussed the procedure including the risks, benefits and alternatives for the proposed anesthesia with the patient or authorized representative who has indicated his/her understanding and acceptance.   Dental advisory given  Plan Discussed with: CRNA  Anesthesia Plan Comments:         Anesthesia Quick Evaluation

## 2012-05-09 NOTE — Progress Notes (Signed)
Dentures retuned to pt

## 2012-05-10 NOTE — Op Note (Signed)
Megan Burgess, Megan Burgess                ACCOUNT NO.:  [id number]  MEDICAL RECORD NO.:  [id number]  LOCATION:                               FACILITY:  [facility]  PHYSICIAN:  Gretta Cool, M.D. DATE OF BIRTH:  1946/03/27  DATE OF PROCEDURE:  05/09/2012 DATE OF DISCHARGE:                              OPERATIVE REPORT   PREOPERATIVE DIAGNOSIS:  Postmenopausal bleeding.  POSTOPERATIVE DIAGNOSIS:  Postmenopausal bleeding.  PROCEDURE:  Hysteroscopy, resection of polypoid areas from the endometrial cavity, and endometrial sampling.  SURGEON:  Gretta Cool, M.D.  ANESTHESIA:  IV sedation and paracervical block.  DESCRIPTION OF PROCEDURE:  Under excellent anesthesia as above with the patient prepped and draped in Allen stirrups with her bladder drained, the cervix was grasped with a single-tooth tenaculum, pulled down to view was progressively dilated with series of Pratt dilators to 33.  The 7 mm resectoscope was then introduced and the cavity photographed. There were polyps noted on the right cornual area at the tubal ostium. That was resected, there was another polyp down the right lateral wall of the uterus that was also resected.  Areas that appeared to have more last atrophic endometrium were vascular in appearance and with some endometrial stimulation were sampled by resection.  There was no other evidence of endometrial pathology.  The left fallopian tube was normal in appearance.  At this point, the bleeding was controlled with the cautery from the loop.  At the end of the procedure, the sponge and lap counts were correct.  There were no complications.  The patient returned to recovery room in excellent condition.          ______________________________ Gretta Cool, M.D.     CWL/MEDQ  D:  05/09/2012  T:  05/10/2012  Job:  [id number]  cc:   [name], M.D., F.A.C.P. Fax: [phone]  [facility] [place], [place]

## 2012-05-11 ENCOUNTER — Encounter (HOSPITAL_BASED_OUTPATIENT_CLINIC_OR_DEPARTMENT_OTHER): Payer: Self-pay | Admitting: Gynecology

## 2012-06-17 ENCOUNTER — Encounter: Payer: Self-pay | Admitting: Gastroenterology

## 2012-07-13 ENCOUNTER — Other Ambulatory Visit (HOSPITAL_BASED_OUTPATIENT_CLINIC_OR_DEPARTMENT_OTHER): Payer: Medicare Other | Admitting: Lab

## 2012-07-13 ENCOUNTER — Other Ambulatory Visit: Payer: Self-pay | Admitting: Oncology

## 2012-07-13 ENCOUNTER — Ambulatory Visit
Admission: RE | Admit: 2012-07-13 | Discharge: 2012-07-13 | Disposition: A | Discharge status: Home / Self Care | Payer: Medicare Other | Source: Ambulatory Visit | Attending: Oncology | Admitting: Oncology

## 2012-07-13 DIAGNOSIS — Z9012 Acquired absence of left breast and nipple: Secondary | ICD-10-CM

## 2012-07-13 DIAGNOSIS — Z1231 Encounter for screening mammogram for malignant neoplasm of breast: Secondary | ICD-10-CM

## 2012-07-13 DIAGNOSIS — Z853 Personal history of malignant neoplasm of breast: Secondary | ICD-10-CM

## 2012-07-13 LAB — CBC WITH DIFFERENTIAL/PLATELET
BASO%: 0.7 % (ref 0.0–2.0)
EOS%: 1.4 % (ref 0.0–7.0)
MCH: 31.2 pg (ref 25.1–34.0)
MCHC: 34.4 g/dL (ref 31.5–36.0)
MONO#: 0.5 10*3/uL (ref 0.1–0.9)
RBC: 4.53 10*6/uL (ref 3.70–5.45)
WBC: 6.8 10*3/uL (ref 3.9–10.3)
lymph#: 2.6 10*3/uL (ref 0.9–3.3)

## 2012-07-13 LAB — COMPREHENSIVE METABOLIC PANEL (CC13)
ALT: 17 U/L (ref 0–55)
AST: 18 U/L (ref 5–34)
CO2: 23 mEq/L (ref 22–29)
Calcium: 9.9 mg/dL (ref 8.4–10.4)
Chloride: 105 mEq/L (ref 98–107)
Sodium: 138 mEq/L (ref 136–145)
Total Bilirubin: 0.6 mg/dL (ref 0.20–1.20)
Total Protein: 6.9 g/dL (ref 6.4–8.3)

## 2012-07-13 LAB — LACTATE DEHYDROGENASE (CC13): LDH: 160 U/L (ref 125–220)

## 2012-07-19 ENCOUNTER — Telehealth: Payer: Self-pay | Admitting: Oncology

## 2012-07-19 ENCOUNTER — Ambulatory Visit (HOSPITAL_BASED_OUTPATIENT_CLINIC_OR_DEPARTMENT_OTHER): Payer: Medicare Other | Admitting: Oncology

## 2012-07-19 VITALS — BP 134/85 | HR 83 | Temp 97.2°F | Resp 20 | Ht 66.0 in | Wt 194.0 lb

## 2012-07-19 DIAGNOSIS — Z9010 Acquired absence of unspecified breast and nipple: Secondary | ICD-10-CM

## 2012-07-19 DIAGNOSIS — Z853 Personal history of malignant neoplasm of breast: Secondary | ICD-10-CM

## 2012-07-19 DIAGNOSIS — Z171 Estrogen receptor negative status [ER-]: Secondary | ICD-10-CM

## 2012-07-19 DIAGNOSIS — C50919 Malignant neoplasm of unspecified site of unspecified female breast: Secondary | ICD-10-CM

## 2012-07-19 NOTE — Telephone Encounter (Signed)
appts made and printed for pt aom °

## 2012-07-19 NOTE — Progress Notes (Signed)
Hematology and Oncology Follow Up Visit  Megan Burgess [id number] Feb 14, 1946 66 y.o. 07/19/2012 2:28 PM   Principle Diagnosis:No diagnosis found.   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  She is achieving a durable remission now out and 23 years from diagnosis and treatment. Right mammogram done in anticipation of today's visit on October 14 shows no new disease in the right breast. She had a biopsy of a benign right breast nodule 06/04/2000.  Evaluation of hypercalcemia back in 2006 was suspicious for a parathyroid adenoma. 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  She had a endometrial polyp versus fibroid tumor removed in 2005. She required a great need procedure recently on 05/09/2012 with hysteroscopy and resection of endometrial polyps under anesthesia for dysfunctional uterine bleeding.  She has ongoing problems with chronic low back pain and uses a TENS stimulator. She has had no new areas of pain. She denies any headache or change in vision, no change in bowel habit, she is going to schedule a routine followup colonoscopy soon with her surgeon.  Her husband is now 46 and suffers from progressive dementia. Megan Burgess is determined to keep him at home. One of her daughters is a Arts administrator and another granddaughter is a Engineer, site.  Medications: reviewed  Allergies: No Known Allergies  Review of Systems: Constitutional:   No constitutional symptoms Respiratory: No cough or dyspnea Cardiovascular:  No chest pain or palpitations Gastrointestinal: See above Genito-Urinary: See above Musculoskeletal: See above Neurologic: See above Skin: No rash Remaining ROS negative.  Physical Exam: Blood pressure  134/85, pulse 83, temperature 97.2 F (36.2 C), temperature source Oral, resp. rate 20, height 5\' 6"  (1.676 m), weight 194 lb (87.998 kg). Wt Readings from Last 3 Encounters:  07/19/12 194 lb (87.998 kg)  05/09/12 193 lb 5 oz (87.686 kg)  05/09/12 193 lb 5 oz (87.686 kg)     General appearance: Well-nourished Caucasian woman HENNT: Pharynx no erythema or exudate; midline scar next status post previous thyroidectomy and parathyroidectomy Lymph nodes: No adenopathy Breasts: Left mastectomy, no chest wall lesions, no right breast masses Lungs: Clear to auscultation resonant to percussion Heart: Regular rhythm no murmur no gallop Abdomen: Soft, nontender, no mass, no organomegaly Extremities: No edema, no calf tenderness Vascular: No cyanosis Neurologic: Motor strength 5 over 5, reflexes absent symmetric at the knees, 1+ symmetric at the biceps Skin:  Lab Results: Lab Results  Component Value Date   WBC 6.8 07/13/2012   HGB 14.1 07/13/2012   HCT 41.0 07/13/2012   MCV 90.6 07/13/2012   PLT 231 07/13/2012     Chemistry      Component Value Date/Time   NA 138 07/13/2012 1137   NA 141 05/09/2012 0745   K 4.8 07/13/2012 1137   K 4.2 05/09/2012 0745   CL 105 07/13/2012 1137   CL 100 07/13/2011 1022   CO2 23 07/13/2012 1137   CO2 25 07/13/2011 1022   BUN 19.0 07/13/2012 1137   BUN 21 07/13/2011 1022   CREATININE 0.8 07/13/2012 1137   CREATININE 0.84 07/13/2011 1022      Component Value Date/Time   CALCIUM 9.9 07/13/2012 1137   CALCIUM 10.1 07/13/2011 1022   ALKPHOS 76 07/13/2012 1137   ALKPHOS 73  07/13/2011 1022   AST 18 07/13/2012 1137   AST 16 07/13/2011 1022   ALT 17 07/13/2012 1137   ALT 12 07/13/2011 1022   BILITOT 0.60 07/13/2012 1137   BILITOT 0.5 07/13/2011 1022       Radiological Studies: Mm Digital Screening Unilat R  07/14/2012  *RADIOLOGY REPORT*  Clinical Data: Screening.  MAMMOGRAPHIC UNILATERAL RIGHT DIGITAL SCREENING WITH CAD  Comparison:  Previous  exams.  Findings:  There are scattered fibroglandular densities. No suspicious masses, architectural distortion, or calcifications are present.  Images were processed with CAD.  IMPRESSION: No mammographic evidence of malignancy.  A result letter of this screening mammogram will be mailed directly to the patient.  RECOMMENDATION: Screening mammogram in one year. (Code:SM-B-01Y)  BI-RADS CATEGORY 2:  Benign finding(s).   Original Report Authenticated By: Sherian Rein, M.D.     Impression and Plan: 1. Stage II, ER negative,  breast cancer treated as noted above.  She remains free of any progression now out remarkable 23 for her and her years. 2. Status post biopsy of a benign nodule, right breast, 06/04/2000. 3. Hyperparathyroidism treated with parathyroidectomy, 06/2005. 4. 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 5. Advanced degenerative arthritis.   CC:. Dr. Smith Robert, Volney Presser; Dr. Maryfrances Bunnell; Dr. Beather Arbour is  Megan Feinstein, MD 10/22/20132:28 PM

## 2012-08-26 ENCOUNTER — Other Ambulatory Visit: Payer: Self-pay | Admitting: *Deleted

## 2012-08-26 DIAGNOSIS — R232 Flushing: Secondary | ICD-10-CM

## 2012-08-26 MED ORDER — VENLAFAXINE HCL ER 37.5 MG PO CP24: 37.5000 mg | ORAL_CAPSULE | Freq: Every day | ORAL | Status: AC

## 2012-08-26 NOTE — Telephone Encounter (Signed)
Pt called for refill on her effexor & states she is out.  Scripts sent electronically

## 2012-11-24 ENCOUNTER — Telehealth: Payer: Self-pay | Admitting: Oncology

## 2012-11-24 NOTE — Telephone Encounter (Signed)
spoke w/pt husband. he's aware of her appt d/t...td

## 2013-07-14 ENCOUNTER — Other Ambulatory Visit (HOSPITAL_BASED_OUTPATIENT_CLINIC_OR_DEPARTMENT_OTHER): Payer: Medicare Other | Admitting: Lab

## 2013-07-14 ENCOUNTER — Encounter (INDEPENDENT_AMBULATORY_CARE_PROVIDER_SITE_OTHER): Payer: Self-pay

## 2013-07-14 ENCOUNTER — Ambulatory Visit
Admission: RE | Admit: 2013-07-14 | Discharge: 2013-07-14 | Disposition: A | Discharge status: Home / Self Care | Payer: Medicare Other | Source: Ambulatory Visit | Attending: Oncology | Admitting: Oncology

## 2013-07-14 DIAGNOSIS — Z853 Personal history of malignant neoplasm of breast: Secondary | ICD-10-CM

## 2013-07-14 DIAGNOSIS — C50919 Malignant neoplasm of unspecified site of unspecified female breast: Secondary | ICD-10-CM

## 2013-07-14 LAB — CBC WITH DIFFERENTIAL/PLATELET
BASO%: 0.9 % (ref 0.0–2.0)
Basophils Absolute: 0.1 10*3/uL (ref 0.0–0.1)
EOS%: 1.6 % (ref 0.0–7.0)
HCT: 40.7 % (ref 34.8–46.6)
HGB: 13.7 g/dL (ref 11.6–15.9)
MCH: 29.7 pg (ref 25.1–34.0)
MCHC: 33.8 g/dL (ref 31.5–36.0)
MCV: 87.9 fL (ref 79.5–101.0)
MONO%: 7.6 % (ref 0.0–14.0)
NEUT%: 57.1 % (ref 38.4–76.8)
lymph#: 2.7 10*3/uL (ref 0.9–3.3)

## 2013-07-14 LAB — COMPREHENSIVE METABOLIC PANEL (CC13)
ALT: 18 U/L (ref 0–55)
AST: 20 U/L (ref 5–34)
Alkaline Phosphatase: 80 U/L (ref 40–150)
BUN: 19.6 mg/dL (ref 7.0–26.0)
Calcium: 9.8 mg/dL (ref 8.4–10.4)
Chloride: 106 mEq/L (ref 98–109)
Creatinine: 0.8 mg/dL (ref 0.6–1.1)
Total Bilirubin: 0.46 mg/dL (ref 0.20–1.20)

## 2013-07-21 ENCOUNTER — Ambulatory Visit: Payer: Medicare Other | Admitting: Oncology

## 2013-07-24 ENCOUNTER — Ambulatory Visit (HOSPITAL_BASED_OUTPATIENT_CLINIC_OR_DEPARTMENT_OTHER): Payer: Medicare Other | Admitting: Oncology

## 2013-07-24 VITALS — BP 151/80 | HR 71 | Temp 97.3°F | Resp 18 | Ht 66.0 in | Wt 199.2 lb

## 2013-07-24 DIAGNOSIS — M479 Spondylosis, unspecified: Secondary | ICD-10-CM

## 2013-07-24 DIAGNOSIS — E039 Hypothyroidism, unspecified: Secondary | ICD-10-CM

## 2013-07-24 DIAGNOSIS — G609 Hereditary and idiopathic neuropathy, unspecified: Secondary | ICD-10-CM

## 2013-07-24 DIAGNOSIS — Z853 Personal history of malignant neoplasm of breast: Secondary | ICD-10-CM

## 2013-07-24 DIAGNOSIS — H409 Unspecified glaucoma: Secondary | ICD-10-CM

## 2013-07-24 MED ORDER — LATANOPROST 0.005 % OP SOLN: 1.0000 [drp] | Freq: Every day | OPHTHALMIC | Status: AC

## 2013-07-25 NOTE — Progress Notes (Signed)
Hematology and Oncology Follow Up Visit  Megan Burgess [id number] December 31, 1945 67 y.o. 07/25/2013 8:44 AM   Principle Diagnosis: Encounter Diagnoses  Name Primary?  . Glaucoma   . 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 She has achievedf a durable remission now out 24 years from diagnosis and treatment. Right mammogram done in anticipation of today's visit on July 14, 2013,  shows no new disease in the right breast. She had a biopsy of a benign right breast nodule 06/04/2000.  Evaluation of hypercalcemia back in 2006 was suspicious for a parathyroid adenoma. 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  She had a endometrial polyp versus fibroid tumor removed in 2005. She required a  Procedure  on 05/09/2012 with hysteroscopy and resection of endometrial polyps under anesthesia for dysfunctional uterine bleeding.  She has had no interim medical problems. 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 He accompanies her today. He is quite pleasant and certainly seems to remember me.   Medications: reviewed  Allergies: No Known Allergies  Review of Systems: Hematology: negative for swollen glands, easy bruising,  ENT ROS: negative for - oral lesions or sore throat Breast ROS: negative for - new or changing breast lumps Respiratory ROS: negative for - cough, pleuritic pain, shortness of breath or wheezing Cardiovascular ROS: negative for - chest pain, dyspnea on exertion, edema, irregular heartbeat, murmur, orthopnea, palpitations, paroxysmal nocturnal dyspnea or rapid heart rate Gastrointestinal ROS: negative for - abdominal pain, appetite  loss, blood in stools, change in bowel habits, constipation, diarrhea, heartburn, hematemesis, melena, nausea/vomiting or swallowing difficulty/pain Genito-Urinary ROS: negative for - dysuria, hematuria, incontinence,,  or urinary frequency/urgency Musculoskeletal ROS: Chronic back pain. Neurological ROS: negative for - behavioral changes, confusion, dizziness, gait disturbance, headaches, impaired coordination/balance, memory loss, numbness/tingling,  Dermatological ROS: negative for rash, ecchymosis Remaining ROS negative.  Physical Exam: Blood pressure 151/80, pulse 71, temperature 97.3 F (36.3 C), temperature source Oral, resp. rate 18, height 5\' 6"  (1.676 m), weight 199 lb 3.2 oz (90.357 kg), SpO2 100.00%. Wt Readings from Last 3 Encounters:  07/24/13 199 lb 3.2 oz (90.357 kg)  07/19/12 194 lb (87.998 kg)  05/09/12 193 lb 5 oz (87.686 kg)     General appearance: Well-nourished Caucasian woman HENNT: Pharynx no erythema, exudate, mass, or ulcer. No thyromegaly or thyroid nodules. Scar in the neck from previous thyroid/parathyroid surgery. Lymph nodes: No cervical, supraclavicular, or axillary lymphadenopathy Breasts: Left mastectomy. No chest wall lesions. No right breast masses. Lungs: Clear to auscultation, resonant to percussion throughout Heart: Regular rhythm, no murmur, no gallop, no rub, no click, no edema Abdomen: Soft, nontender, normal bowel sounds, no mass, no organomegaly Extremities: No edema, no calf tenderness Musculoskeletal: no joint deformities GU: Vascular: Carotid pulses 2+, no bruits, distal pulses: Dorsalis pedis 1+ symmetric Neurologic: Alert, oriented, PERRLA,  cranial nerves grossly normal, motor strength 5 over 5, reflexes 1+ symmetric, upper body coordination normal, gait normal, Skin: No rash or ecchymosis  Lab Results: CBC W/Diff    Component Value Date/Time   WBC 8.1 07/14/2013 0948   WBC 9.9 05/18/2009 0344   RBC 4.63 07/14/2013 0948  RBC 3.26*  05/18/2009 0344   HGB 13.7 07/14/2013 0948   HGB 15.6* 05/09/2012 0745   HCT 40.7 07/14/2013 0948   HCT 46.0 05/09/2012 0745   PLT 263 07/14/2013 0948   PLT 204 05/18/2009 0344   MCV 87.9 07/14/2013 0948   MCV 85.9 05/18/2009 0344   MCH 29.7 07/14/2013 0948   MCH 30.8 07/07/2010 1401   MCHC 33.8 07/14/2013 0948   MCHC 33.4 05/18/2009 0344   RDW 14.1 07/14/2013 0948   RDW 16.9* 05/18/2009 0344   LYMPHSABS 2.7 07/14/2013 0948   LYMPHSABS 2.5 05/10/2009 1045   MONOABS 0.6 07/14/2013 0948   MONOABS 0.6 05/10/2009 1045   EOSABS 0.1 07/14/2013 0948   EOSABS 0.1 05/10/2009 1045   BASOSABS 0.1 07/14/2013 0948   BASOSABS 0.0 05/10/2009 1045     Chemistry      Component Value Date/Time   NA 139 07/14/2013 0948   NA 141 05/09/2012 0745   K 4.6 07/14/2013 0948   K 4.2 05/09/2012 0745   CL 105 07/13/2012 1137   CL 100 07/13/2011 1022   CO2 25 07/14/2013 0948   CO2 25 07/13/2011 1022   BUN 19.6 07/14/2013 0948   BUN 21 07/13/2011 1022   CREATININE 0.8 07/14/2013 0948   CREATININE 0.84 07/13/2011 1022      Component Value Date/Time   CALCIUM 9.8 07/14/2013 0948   CALCIUM 10.1 07/13/2011 1022   ALKPHOS 80 07/14/2013 0948   ALKPHOS 73 07/13/2011 1022   AST 20 07/14/2013 0948   AST 16 07/13/2011 1022   ALT 18 07/14/2013 0948   ALT 12 07/13/2011 1022   BILITOT 0.46 07/14/2013 0948   BILITOT 0.5 07/13/2011 1022       Radiological Studies: Mm Digital Screening Unilat R  07/14/2013   CLINICAL DATA:  Screening. Post mastectomy of the left breast in 1990 and chemotherapy in 1991  EXAM: DIGITAL SCREENING UNILATERAL RIGHT MAMMOGRAM WITH CAD  COMPARISON:  Previous exam(s).  ACR Breast Density Category b: There are scattered areas of fibroglandular density.  FINDINGS: There are no findings suspicious for malignancy. Images were processed with CAD.  IMPRESSION: No mammographic evidence of malignancy. A result letter of this screening mammogram will be mailed directly to the patient.  RECOMMENDATION:  Screening mammogram in one year. (Code:SM-B-01Y)  BI-RADS CATEGORY  1: Negative   Electronically Signed   By: Leda Gauze M.D.   On: 07/14/2013 17:50    Impression:  #1. Stage II, ER negative, breast cancer treated as noted above. She remains free of any progression now out remarkable 24r years.  #2. Status post biopsy of a benign nodule, right breast, 06/04/2000.  #3. Hyperparathyroidism treated with parathyroidectomy, 06/2005.  #4. 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  #5. Advanced degenerative arthritis of the spine  I told her that she can graduate from our practice at this time. It has been my great pleasure to care for this woman who is one of my first patients in [place].   CC: Patient Care Team: Lindwood Qua as PCP - General (Internal Medicine)   [name], MD 10/28/20148:44 AM

## 2013-07-26 ENCOUNTER — Telehealth: Payer: Self-pay | Admitting: Oncology

## 2013-07-26 NOTE — Telephone Encounter (Signed)
lmonvm advising the pt of her mamm[facility] appt in oct 2015 at [facility].

## 2013-07-26 NOTE — Telephone Encounter (Signed)
Pt to return prn as needed.

## 2013-10-16 DIAGNOSIS — H409 Unspecified glaucoma: Secondary | ICD-10-CM | POA: Insufficient documentation

## 2013-10-16 DIAGNOSIS — E785 Hyperlipidemia, unspecified: Secondary | ICD-10-CM | POA: Insufficient documentation

## 2013-10-16 DIAGNOSIS — M549 Dorsalgia, unspecified: Secondary | ICD-10-CM | POA: Insufficient documentation

## 2013-10-16 DIAGNOSIS — E039 Hypothyroidism, unspecified: Secondary | ICD-10-CM | POA: Insufficient documentation

## 2014-02-22 ENCOUNTER — Ambulatory Visit: Payer: Medicare Other | Admitting: Family Medicine

## 2014-07-16 ENCOUNTER — Ambulatory Visit
Admission: RE | Admit: 2014-07-16 | Discharge: 2014-07-16 | Disposition: A | Discharge status: Home / Self Care | Payer: PRIVATE HEALTH INSURANCE | Source: Ambulatory Visit | Attending: Oncology | Admitting: Oncology

## 2014-07-16 DIAGNOSIS — Z853 Personal history of malignant neoplasm of breast: Secondary | ICD-10-CM

## 2015-01-04 IMAGING — MG MM DIGITAL SCREENING UNILAT*R* W/ CAD
2 series · 2 of 2 positions shown · non-contrast
Comparison: Previous exam(s).

CLINICAL DATA: Screening.

EXAM:
DIGITAL SCREENING UNILATERAL RIGHT MAMMOGRAM WITH CAD

[R CC]
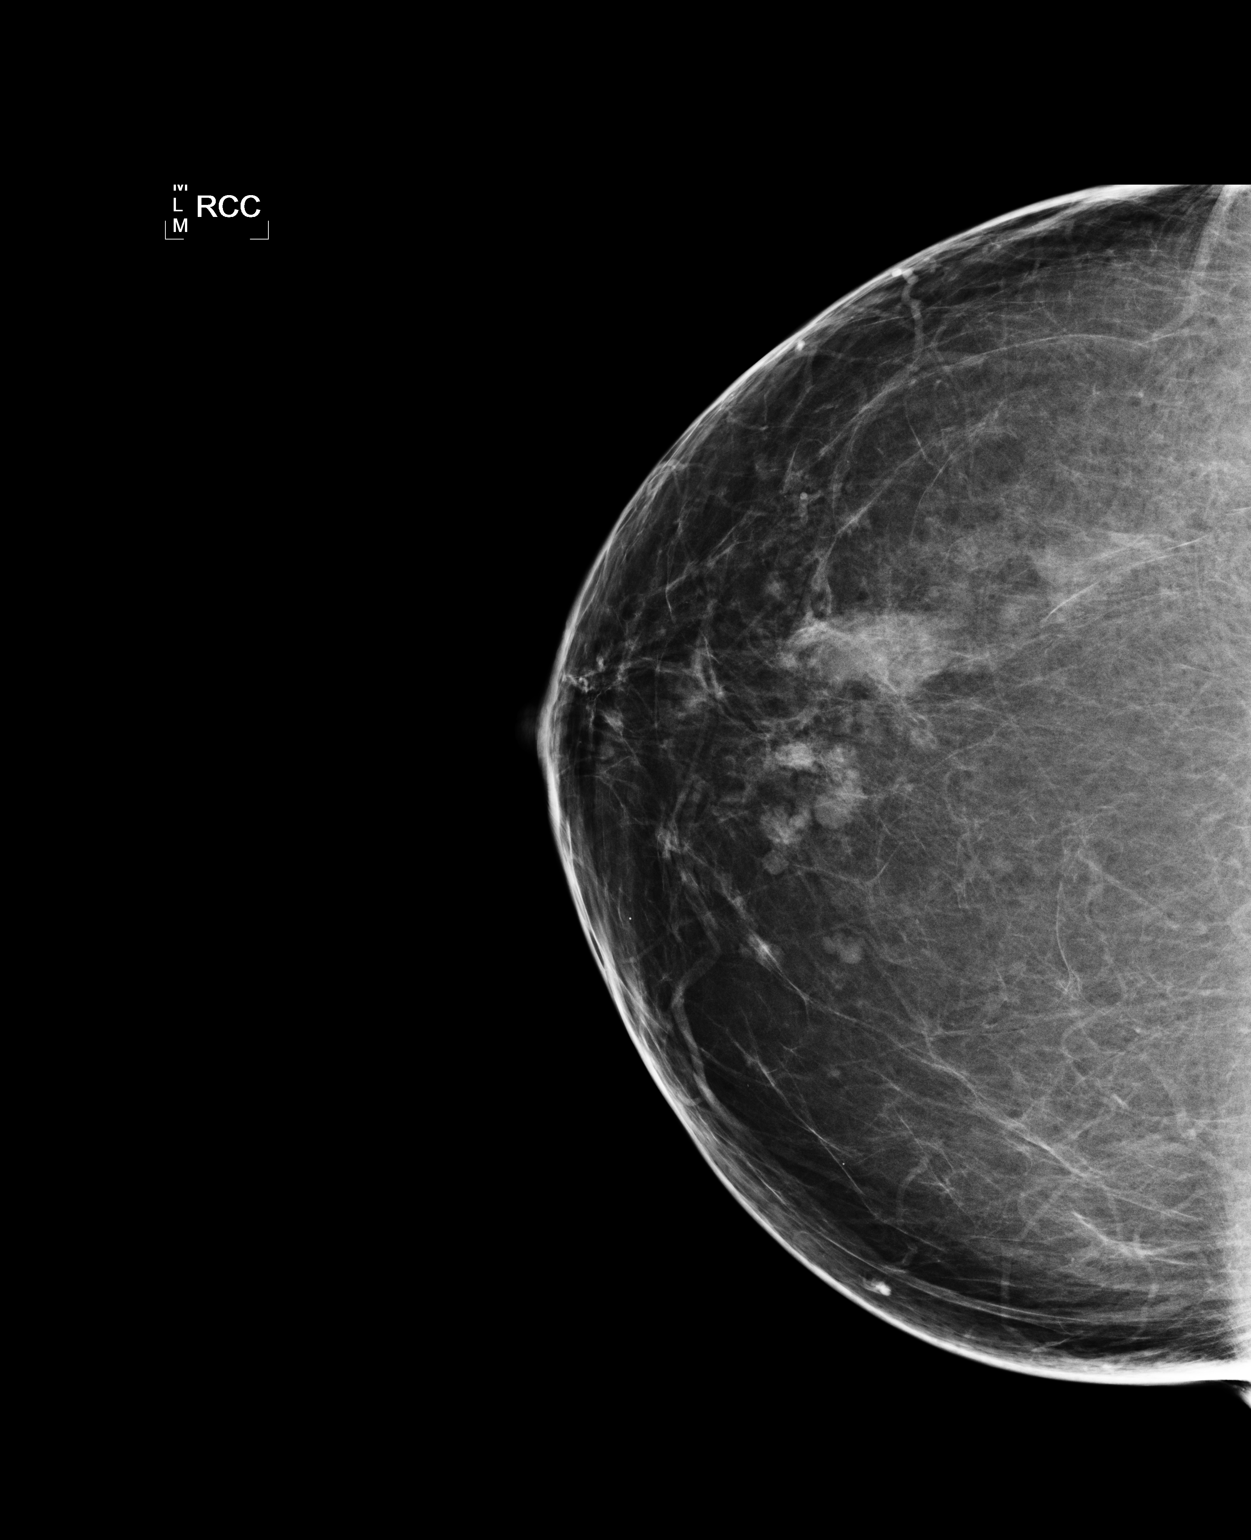

[R MLO]
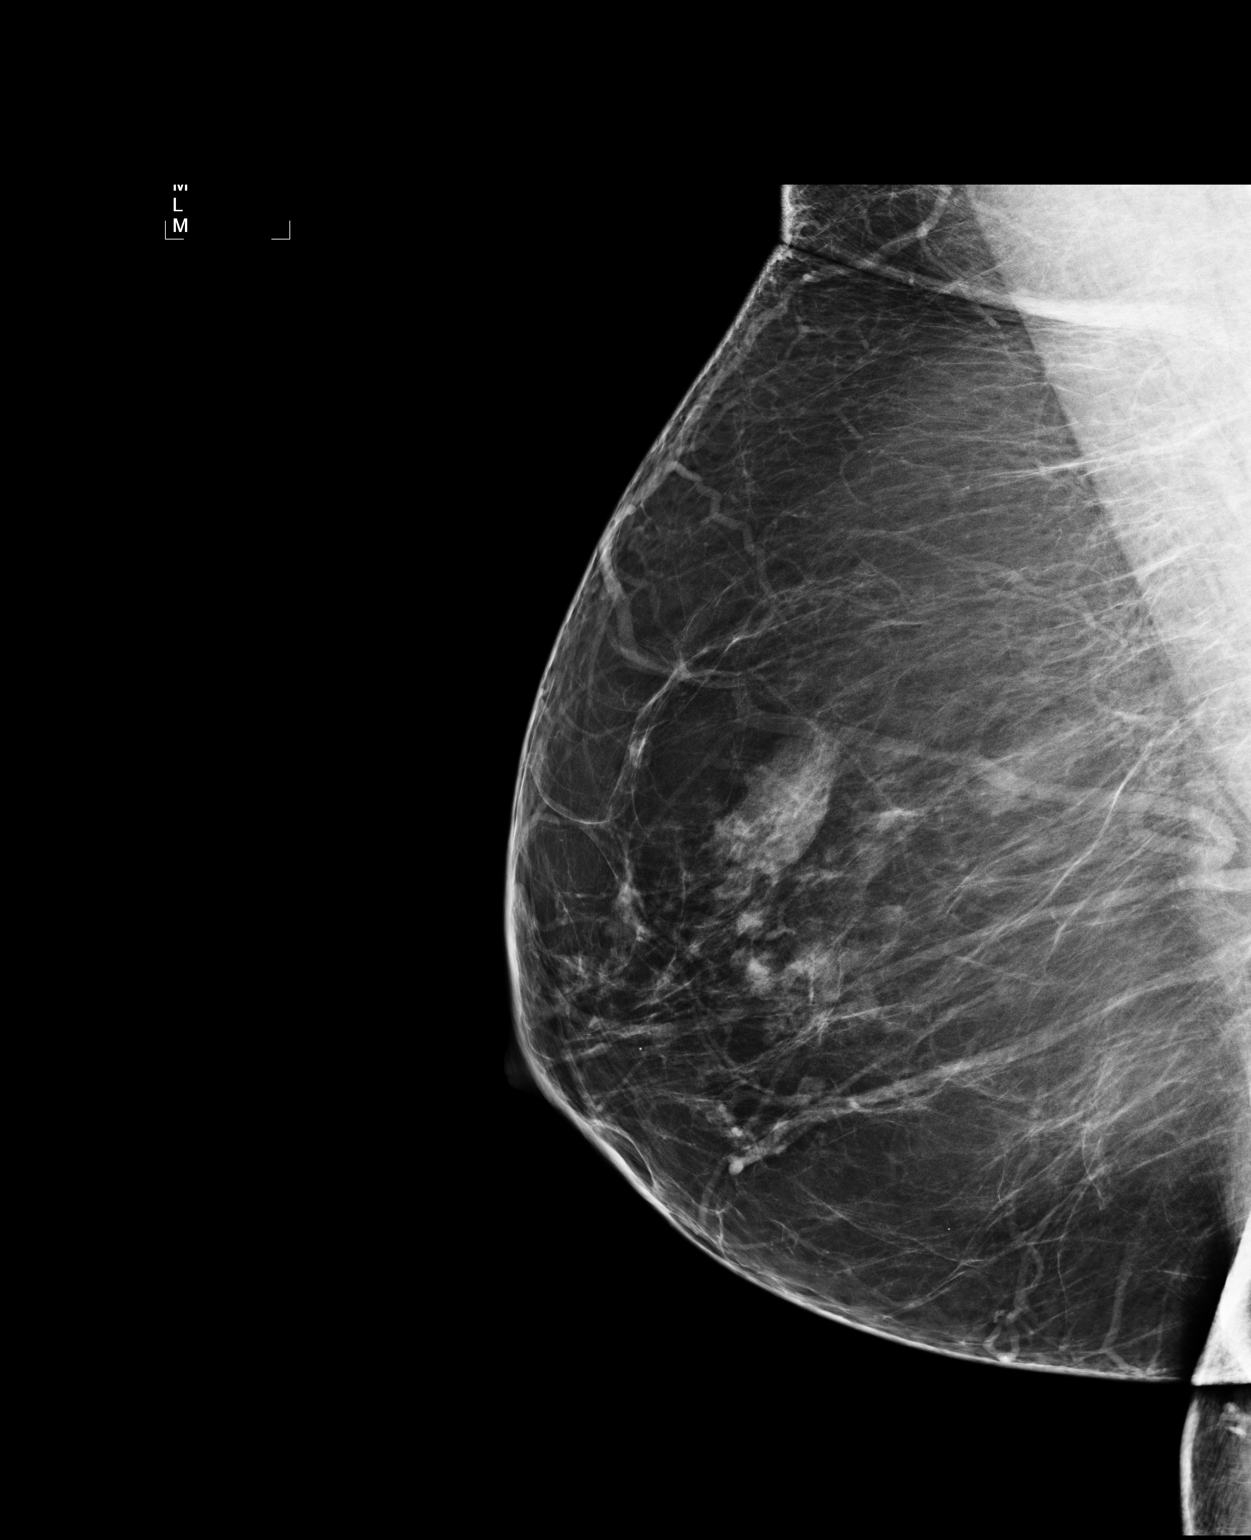

[2 of 2 positions shown; findings below may reference images not displayed]

ACR Breast Density Category b: There are scattered areas of
fibroglandular density.
FINDINGS: There are no findings suspicious for malignancy. Images were
processed with CAD.
IMPRESSION: No mammographic evidence of malignancy. A result letter of this
screening mammogram will be mailed directly to the patient.

RECOMMENDATION:
Screening mammogram in one year. (Code:BU-[id number])

BI-RADS CATEGORY  1: Negative.

## 2015-11-01 DIAGNOSIS — M5136 Other intervertebral disc degeneration, lumbar region: Secondary | ICD-10-CM | POA: Insufficient documentation

## 2015-11-01 DIAGNOSIS — G629 Polyneuropathy, unspecified: Secondary | ICD-10-CM | POA: Insufficient documentation

## 2016-02-10 DIAGNOSIS — E538 Deficiency of other specified B group vitamins: Secondary | ICD-10-CM | POA: Insufficient documentation

## 2016-07-06 DIAGNOSIS — Z79899 Other long term (current) drug therapy: Secondary | ICD-10-CM | POA: Insufficient documentation

## 2017-01-16 DIAGNOSIS — I4510 Unspecified right bundle-branch block: Secondary | ICD-10-CM | POA: Insufficient documentation

## 2017-01-18 NOTE — Progress Notes (Signed)
Please place orders n EPIC as patient is being scheduled for a Pre-op appointment! Thank you!

## 2017-01-27 NOTE — H&P (Signed)
TOTAL HIP ADMISSION H&P  Patient is admitted for right total hip arthroplasty, anterior approach.  Subjective:  Chief Complaint:       Right hip primary OA /pain  HPI: Megan Burgess, 71 y.o. female, has a history of pain and functional disability in the right hip(s) due to arthritis and patient has failed non-surgical conservative treatments for greater than 12 weeks to include NSAID's and/or analgesics, corticosteriod injections and activity modification.  Onset of symptoms was gradual starting 6 months ago with rapidlly worsening course since that time.The patient noted no past surgery on the right hip(s).  Patient currently rates pain in the right hip at 8 out of 10 with activity. Patient has worsening of pain with activity and weight bearing, trendelenberg gait, pain that interfers with activities of daily living and pain with passive range of motion. Patient has evidence of periarticular osteophytes and joint space narrowing by imaging studies. This condition presents safety issues increasing the risk of falls.  There is no current active infection.   Risks, benefits and expectations were discussed with the patient.  Risks including but not limited to the risk of anesthesia, blood clots, nerve damage, blood vessel damage, failure of the prosthesis, infection and up to and including death.  Patient understand the risks, benefits and expectations and wishes to proceed with surgery.   PCP: Raelene Bott, MD  D/C Plans:       Home   Post-op Meds:       No Rx given  Tranexamic Acid:      To be given - IV    Decadron:      Is to be given  FYI:     Xarelto (can't tolerate ASA)  Norco   ! Make sure to restart all eye meds !  DME:   Pt already has equipment   PT:   No PT   Patient Active Problem List   Diagnosis Date Noted  . ABNORMAL EKG 04/17/2009  . HYPERCALCEMIA 04/12/2009  . ANXIETY 06/20/2007  . ALLERGIC RHINITIS 06/20/2007  . GERD 06/20/2007  . OSTEOARTHRITIS 06/20/2007  .  BREAST CANCER, HX OF 06/20/2007  . COLONIC POLYPS, HX OF 06/20/2007   Past Medical History:  Diagnosis Date  . Anxiety   . Complication of anesthesia    severe indigestion  . GERD (gastroesophageal reflux disease)   . Hyperlipidemia   . Hypothyroidism     Past Surgical History:  Procedure Laterality Date  . APPENDECTOMY    . BACK SURGERY  10/04   thoracic laminectomy w spinal cord stimulator  . bil wrist surgery    . CARDIAC CATHETERIZATION  '97  . CHOLECYSTECTOMY  '79  . DILATION AND CURETTAGE OF UTERUS  5/05   hysteroscopy/resectoscope  . EYE SURGERY     bil cataract w iol  . HYSTEROSCOPY W/D&C  05/09/2012   Procedure: DILATATION AND CURETTAGE /HYSTEROSCOPY;  Surgeon: Selinda Orion, MD;  Location: [facility];  Service: Gynecology;  Laterality: N/A;  needs resectoscope   . JOINT REPLACEMENT  8/10   rt knee arthroplasty  . MASTECTOMY    . NASAL SINUS SURGERY  '92  . PARATHYROIDECTOMY  10/06   neck exploration    No prescriptions prior to admission.   No Known Allergies  Social History  Substance Use Topics  . Smoking status: Former Smoker    Quit date: 05/02/1992  . Smokeless tobacco: Not on file  . Alcohol use No       Review of  Systems  Constitutional: Negative.   HENT: Negative.   Eyes: Negative.   Respiratory: Negative.   Cardiovascular: Negative.   Gastrointestinal: Positive for heartburn.  Genitourinary: Negative.   Musculoskeletal: Positive for joint pain.  Skin: Negative.   Neurological: Negative.   Endo/Heme/Allergies: Positive for environmental allergies.  Psychiatric/Behavioral: The patient is nervous/anxious.     Objective:  Physical Exam  Constitutional: She is oriented to person, place, and time. She appears well-developed.  HENT:  Head: Normocephalic.  Eyes: Pupils are equal, round, and reactive to light.  Neck: Neck supple. No JVD present. No tracheal deviation present. No thyromegaly present.  Cardiovascular: Normal  rate, regular rhythm and intact distal pulses.   Respiratory: Effort normal and breath sounds normal. No respiratory distress. She has no wheezes.  GI: Soft. There is no tenderness. There is no guarding.  Musculoskeletal:       Right hip: She exhibits decreased range of motion, decreased strength, tenderness and bony tenderness. She exhibits no swelling, no deformity and no laceration.  Lymphadenopathy:    She has no cervical adenopathy.  Neurological: She is alert and oriented to person, place, and time.  Skin: Skin is warm and dry.  Psychiatric: She has a normal mood and affect.      Labs:  Estimated body mass index is 32.15 kg/m as calculated from the following:   Height as of 07/24/13: 5\' 6"  (1.676 m).   Weight as of 07/24/13: 90.4 kg (199 lb 3.2 oz).   Imaging Review Plain radiographs demonstrate severe degenerative joint disease of the right hip(s). The bone quality appears to be good for age and reported activity level.  Assessment/Plan:  End stage arthritis, right hip(s)  The patient history, physical examination, clinical judgement of the provider and imaging studies are consistent with end stage degenerative joint disease of the right hip(s) and total hip arthroplasty is deemed medically necessary. The treatment options including medical management, injection therapy, arthroscopy and arthroplasty were discussed at length. The risks and benefits of total hip arthroplasty were presented and reviewed. The risks due to aseptic loosening, infection, stiffness, dislocation/subluxation,  thromboembolic complications and other imponderables were discussed.  The patient acknowledged the explanation, agreed to proceed with the plan and consent was signed. Patient is being admitted for inpatient treatment for surgery, pain control, PT, OT, prophylactic antibiotics, VTE prophylaxis, progressive ambulation and ADL's and discharge planning.The patient is planning to be discharged  home.       [place] [name]   PA-C  01/27/2017, 11:46 AM

## 2017-02-05 ENCOUNTER — Encounter (HOSPITAL_COMMUNITY): Payer: Self-pay

## 2017-02-05 ENCOUNTER — Encounter (HOSPITAL_COMMUNITY)
Admission: RE | Admit: 2017-02-05 | Discharge: 2017-02-05 | Disposition: A | Discharge status: Home / Self Care | Payer: Medicare Other | Source: Ambulatory Visit | Attending: Orthopedic Surgery | Admitting: Orthopedic Surgery

## 2017-02-05 DIAGNOSIS — Z01812 Encounter for preprocedural laboratory examination: Secondary | ICD-10-CM | POA: Diagnosis present

## 2017-02-05 DIAGNOSIS — M1611 Unilateral primary osteoarthritis, right hip: Secondary | ICD-10-CM | POA: Diagnosis not present

## 2017-02-05 DIAGNOSIS — Z0183 Encounter for blood typing: Secondary | ICD-10-CM | POA: Diagnosis not present

## 2017-02-05 HISTORY — DX: Malignant (primary) neoplasm, unspecified: C80.1

## 2017-02-05 HISTORY — DX: Unspecified osteoarthritis, unspecified site: M19.90

## 2017-02-05 LAB — SURGICAL PCR SCREEN
MRSA, PCR: NEGATIVE
STAPHYLOCOCCUS AUREUS: NEGATIVE

## 2017-02-05 NOTE — Patient Instructions (Addendum)
Megan Burgess  02/05/2017   Your procedure is scheduled on: 02/09/17  Report to [facility] Main  Entrance Follow signs to Short Stay on first floor at 9:30 AM  Call this number if you have problems the morning of surgery [phone]   Remember: ONLY 1 PERSON MAY GO WITH YOU TO SHORT STAY TO GET  READY MORNING OF [name].  Do not eat food or drink liquids :After Midnight.     Take these medicines the morning of surgery with A SIP OF WATER: Alprazolam, Levothyroxine, Venlafaxin, Truopt eye drops                               You may not have any metal on your body including hair pins and              piercings  Do not wear jewelry, make-up, lotions, powders or perfumes, deodorant             Do not wear nail polish.  Do not shave  48 hours prior to surgery.              Men may shave face and neck.   Do not bring valuables to the hospital. [place].  Contacts, dentures or bridgework may not be worn into surgery.  Leave suitcase in the car. After surgery it may be brought to your room.               Please read over the following fact sheets you were given: _____________________________________________________________________             [facility] - Preparing for Surgery Before surgery, you can play an important role.  Because skin is not sterile, your skin needs to be as free of germs as possible.  You can reduce the number of germs on your skin by washing with CHG (chlorahexidine gluconate) soap before surgery.  CHG is an antiseptic cleaner which kills germs and bonds with the skin to continue killing germs even after washing. Please DO NOT use if you have an allergy to CHG or antibacterial soaps.  If your skin becomes reddened/irritated stop using the CHG and inform your nurse when you arrive at Short Stay. Do not shave (including legs and underarms) for at least 48 hours prior to the  first CHG shower.  You may shave your face/neck. Please follow these instructions carefully:  1.  Shower with CHG Soap the night before surgery and the  morning of Surgery.  2.  If you choose to wash your hair, wash your hair first as usual with your  normal  shampoo.  3.  After you shampoo, rinse your hair and body thoroughly to remove the  shampoo.                           4.  Use CHG as you would any other liquid soap.  You can apply chg directly  to the skin and wash                       Gently with a scrungie or clean washcloth.  5.  Apply the CHG Soap to  your body ONLY FROM THE NECK DOWN.   Do not use on face/ open                           Wound or open sores. Avoid contact with eyes, ears mouth and genitals (private parts).                       Wash face,  Genitals (private parts) with your normal soap.             6.  Wash thoroughly, paying special attention to the area where your surgery  will be performed.  7.  Thoroughly rinse your body with warm water from the neck down.  8.  DO NOT shower/wash with your normal soap after using and rinsing off  the CHG Soap.                9.  Pat yourself dry with a clean towel.            10.  Wear clean pajamas.            11.  Place clean sheets on your bed the night of your first shower and do not  sleep with pets. Day of Surgery : Do not apply any lotions/deodorants the morning of surgery.  Please wear clean clothes to the hospital/surgery center.  FAILURE TO FOLLOW THESE INSTRUCTIONS MAY RESULT IN THE CANCELLATION OF YOUR SURGERY PATIENT SIGNATURE_________________________________  NURSE SIGNATURE__________________________________  ________________________________________________________________________   Megan Burgess  An incentive spirometer is a tool that can help keep your lungs clear and active. This tool measures how well you are filling your lungs with each breath. Taking long deep breaths may help reverse or decrease  the chance of developing breathing (pulmonary) problems (especially infection) following:  A long period of time when you are unable to move or be active. BEFORE THE PROCEDURE   If the spirometer includes an indicator to show your best effort, your nurse or respiratory therapist will set it to a desired goal.  If possible, sit up straight or lean slightly forward. Try not to slouch.  Hold the incentive spirometer in an upright position. INSTRUCTIONS FOR USE  1. Sit on the edge of your bed if possible, or sit up as far as you can in bed or on a chair. 2. Hold the incentive spirometer in an upright position. 3. Breathe out normally. 4. Place the mouthpiece in your mouth and seal your lips tightly around it. 5. Breathe in slowly and as deeply as possible, raising the piston or the ball toward the top of the column. 6. Hold your breath for 3-5 seconds or for as long as possible. Allow the piston or ball to fall to the bottom of the column. 7. Remove the mouthpiece from your mouth and breathe out normally. 8. Rest for a few seconds and repeat Steps 1 through 7 at least 10 times every 1-2 hours when you are awake. Take your time and take a few normal breaths between deep breaths. 9. The spirometer may include an indicator to show your best effort. Use the indicator as a goal to work toward during each repetition. 10. After each set of 10 deep breaths, practice coughing to be sure your lungs are clear. If you have an incision (the cut made at the time of surgery), support your incision when coughing by placing a pillow or rolled up towels firmly against  it. Once you are able to get out of bed, walk around indoors and cough well. You may stop using the incentive spirometer when instructed by your caregiver.  RISKS AND COMPLICATIONS  Take your time so you do not get dizzy or light-headed.  If you are in pain, you may need to take or ask for pain medication before doing incentive spirometry. It is  harder to take a deep breath if you are having pain. AFTER USE  Rest and breathe slowly and easily.  It can be helpful to keep track of a log of your progress. Your caregiver can provide you with a simple table to help with this. If you are using the spirometer at home, follow these instructions: [place] IF:   You are having difficultly using the spirometer.  You have trouble using the spirometer as often as instructed.  Your pain medication is not giving enough relief while using the spirometer.  You develop fever of 100.5 F (38.1 C) or higher. SEEK IMMEDIATE MEDICAL CARE IF:   You cough up bloody sputum that had not been present before.  You develop fever of 102 F (38.9 C) or greater.  You develop worsening pain at or near the incision site. MAKE SURE YOU:   Understand these instructions.  Will watch your condition.  Will get help right away if you are not doing well or get worse. Document Released: 01/25/2007 Document Revised: 12/07/2011 Document Reviewed: 03/28/2007 [facility] Patient Information 2014 [place], [place].   ________________________________________________________________________

## 2017-02-05 NOTE — Pre-Procedure Instructions (Addendum)
Medical clearance Dr. Heber [place], on chart LOV with CMP, CBC/diff  01-16-17 on chart   Pt has a spinal cord stimulator.   Spoke with pt on the phone and requested she arrive morning of surgery at 9:00 am per Dr. Aurea Graff 2.5 hour request.

## 2017-02-09 ENCOUNTER — Inpatient Hospital Stay (HOSPITAL_COMMUNITY): Payer: Medicare Other | Admitting: Registered Nurse

## 2017-02-09 ENCOUNTER — Encounter (HOSPITAL_COMMUNITY): Payer: Self-pay

## 2017-02-09 ENCOUNTER — Encounter (HOSPITAL_COMMUNITY)
Admission: RE | Disposition: A | Discharge status: Home / Self Care | Payer: Self-pay | Source: Ambulatory Visit | Attending: Orthopedic Surgery

## 2017-02-09 ENCOUNTER — Inpatient Hospital Stay (HOSPITAL_COMMUNITY)
Admission: RE | Admit: 2017-02-09 | Discharge: 2017-02-11 | DRG: 470 | Disposition: A | Discharge status: Home / Self Care | Payer: Medicare Other | Source: Ambulatory Visit | Attending: Orthopedic Surgery | Admitting: Orthopedic Surgery

## 2017-02-09 ENCOUNTER — Inpatient Hospital Stay (HOSPITAL_COMMUNITY): Payer: Medicare Other

## 2017-02-09 DIAGNOSIS — Z87891 Personal history of nicotine dependence: Secondary | ICD-10-CM

## 2017-02-09 DIAGNOSIS — Z96651 Presence of right artificial knee joint: Secondary | ICD-10-CM | POA: Diagnosis present

## 2017-02-09 DIAGNOSIS — E669 Obesity, unspecified: Secondary | ICD-10-CM | POA: Diagnosis present

## 2017-02-09 DIAGNOSIS — M1611 Unilateral primary osteoarthritis, right hip: Secondary | ICD-10-CM | POA: Diagnosis present

## 2017-02-09 DIAGNOSIS — Z6832 Body mass index (BMI) 32.0-32.9, adult: Secondary | ICD-10-CM | POA: Diagnosis not present

## 2017-02-09 DIAGNOSIS — E039 Hypothyroidism, unspecified: Secondary | ICD-10-CM | POA: Diagnosis present

## 2017-02-09 DIAGNOSIS — Z853 Personal history of malignant neoplasm of breast: Secondary | ICD-10-CM | POA: Diagnosis not present

## 2017-02-09 DIAGNOSIS — M25751 Osteophyte, right hip: Secondary | ICD-10-CM | POA: Diagnosis present

## 2017-02-09 DIAGNOSIS — Z96649 Presence of unspecified artificial hip joint: Secondary | ICD-10-CM

## 2017-02-09 DIAGNOSIS — Z96641 Presence of right artificial hip joint: Secondary | ICD-10-CM

## 2017-02-09 HISTORY — PX: TOTAL HIP ARTHROPLASTY: SHX124

## 2017-02-09 LAB — TYPE AND SCREEN
ABO/RH(D): O POS
ANTIBODY SCREEN: NEGATIVE

## 2017-02-09 LAB — POCT I-STAT 4, (NA,K, GLUC, HGB,HCT)
GLUCOSE: 132 mg/dL — AB (ref 65–99)
HEMATOCRIT: 33 % — AB (ref 36.0–46.0)
Hemoglobin: 11.2 g/dL — ABNORMAL LOW (ref 12.0–15.0)
Potassium: 4 mmol/L (ref 3.5–5.1)
Sodium: 138 mmol/L (ref 135–145)

## 2017-02-09 SURGERY — ARTHROPLASTY, HIP, TOTAL, ANTERIOR APPROACH
Anesthesia: Monitor Anesthesia Care | Site: Hip | Laterality: Right

## 2017-02-09 MED ORDER — CELECOXIB 200 MG PO CAPS
200.0000 mg | ORAL_CAPSULE | Freq: Two times a day (BID) | ORAL | Status: DC
  Administered 2017-02-09 – 2017-02-11 (×4): 200 mg via ORAL
  Filled 2017-02-09 (×4): qty 1

## 2017-02-09 MED ORDER — MIDAZOLAM HCL 5 MG/5ML IJ SOLN
INTRAMUSCULAR | Status: DC | PRN
  Administered 2017-02-09: 2 mg via INTRAVENOUS

## 2017-02-09 MED ORDER — VENLAFAXINE HCL ER 37.5 MG PO CP24
37.5000 mg | ORAL_CAPSULE | Freq: Every day | ORAL | Status: DC
  Administered 2017-02-10 – 2017-02-11 (×2): 37.5 mg via ORAL
  Filled 2017-02-09 (×2): qty 1

## 2017-02-09 MED ORDER — LEVOTHYROXINE SODIUM 112 MCG PO TABS
112.0000 ug | ORAL_TABLET | Freq: Every day | ORAL | Status: DC
  Administered 2017-02-10 – 2017-02-11 (×2): 112 ug via ORAL
  Filled 2017-02-09 (×2): qty 1

## 2017-02-09 MED ORDER — METOCLOPRAMIDE HCL 5 MG/ML IJ SOLN
5.0000 mg | Freq: Three times a day (TID) | INTRAMUSCULAR | Status: DC | PRN
  Administered 2017-02-09: 10 mg via INTRAVENOUS
  Filled 2017-02-09: qty 2

## 2017-02-09 MED ORDER — MIDAZOLAM HCL 2 MG/2ML IJ SOLN
INTRAMUSCULAR | Status: AC
  Filled 2017-02-09: qty 2

## 2017-02-09 MED ORDER — EPHEDRINE 5 MG/ML INJ
INTRAVENOUS | Status: AC
  Filled 2017-02-09: qty 10

## 2017-02-09 MED ORDER — ONDANSETRON HCL 4 MG/2ML IJ SOLN: 4.0000 mg | Freq: Four times a day (QID) | INTRAMUSCULAR | Status: DC | PRN

## 2017-02-09 MED ORDER — CEFAZOLIN SODIUM-DEXTROSE 2-4 GM/100ML-% IV SOLN
2.0000 g | INTRAVENOUS | Status: AC
  Administered 2017-02-09: 2 g via INTRAVENOUS

## 2017-02-09 MED ORDER — DEXTROSE 5 % IV SOLN
500.0000 mg | Freq: Four times a day (QID) | INTRAVENOUS | Status: DC | PRN
  Administered 2017-02-09: 500 mg via INTRAVENOUS
  Filled 2017-02-09: qty 550

## 2017-02-09 MED ORDER — PROPOFOL 10 MG/ML IV BOLUS
INTRAVENOUS | Status: AC
  Filled 2017-02-09: qty 60

## 2017-02-09 MED ORDER — OXYCODONE HCL 5 MG/5ML PO SOLN
5.0000 mg | Freq: Once | ORAL | Status: DC | PRN
  Filled 2017-02-09: qty 5

## 2017-02-09 MED ORDER — HYDROMORPHONE HCL 1 MG/ML IJ SOLN
0.5000 mg | INTRAMUSCULAR | Status: DC | PRN
  Administered 2017-02-09: 15:00:00 1 mg via INTRAVENOUS
  Filled 2017-02-09: qty 1

## 2017-02-09 MED ORDER — LATANOPROST 0.005 % OP SOLN
1.0000 [drp] | Freq: Every day | OPHTHALMIC | Status: DC
  Administered 2017-02-09 – 2017-02-10 (×2): 1 [drp] via OPHTHALMIC
  Filled 2017-02-09: qty 2.5

## 2017-02-09 MED ORDER — FENTANYL CITRATE (PF) 250 MCG/5ML IJ SOLN
INTRAMUSCULAR | Status: AC
  Filled 2017-02-09: qty 5

## 2017-02-09 MED ORDER — ONDANSETRON HCL 4 MG PO TABS: 4.0000 mg | ORAL_TABLET | Freq: Four times a day (QID) | ORAL | Status: DC | PRN

## 2017-02-09 MED ORDER — CEFAZOLIN SODIUM-DEXTROSE 2-4 GM/100ML-% IV SOLN
2.0000 g | Freq: Four times a day (QID) | INTRAVENOUS | Status: AC
  Administered 2017-02-09 (×2): 2 g via INTRAVENOUS
  Filled 2017-02-09: qty 100

## 2017-02-09 MED ORDER — DOCUSATE SODIUM 100 MG PO CAPS: 100.0000 mg | ORAL_CAPSULE | Freq: Two times a day (BID) | ORAL | 0 refills | Status: AC

## 2017-02-09 MED ORDER — HYDROCODONE-ACETAMINOPHEN 7.5-325 MG PO TABS
1.0000 | ORAL_TABLET | ORAL | Status: DC
  Administered 2017-02-09 – 2017-02-11 (×6): 2 via ORAL
  Filled 2017-02-09 (×6): qty 2

## 2017-02-09 MED ORDER — LACTATED RINGERS IV SOLN
INTRAVENOUS | Status: DC | PRN
  Administered 2017-02-09 (×2): via INTRAVENOUS

## 2017-02-09 MED ORDER — DEXAMETHASONE SODIUM PHOSPHATE 10 MG/ML IJ SOLN
INTRAMUSCULAR | Status: AC
  Filled 2017-02-09: qty 1

## 2017-02-09 MED ORDER — EPHEDRINE SULFATE 50 MG/ML IJ SOLN
INTRAMUSCULAR | Status: DC | PRN
  Administered 2017-02-09 (×2): 10 mg via INTRAVENOUS

## 2017-02-09 MED ORDER — ALBUMIN HUMAN 5 % IV SOLN
INTRAVENOUS | Status: DC | PRN
  Administered 2017-02-09: 12:00:00 via INTRAVENOUS

## 2017-02-09 MED ORDER — DEXAMETHASONE SODIUM PHOSPHATE 10 MG/ML IJ SOLN
10.0000 mg | Freq: Once | INTRAMUSCULAR | Status: AC
  Administered 2017-02-10: 10 mg via INTRAVENOUS
  Filled 2017-02-09: qty 1

## 2017-02-09 MED ORDER — FENTANYL CITRATE (PF) 100 MCG/2ML IJ SOLN
25.0000 ug | INTRAMUSCULAR | Status: DC | PRN
  Administered 2017-02-09 (×2): 25 ug via INTRAVENOUS

## 2017-02-09 MED ORDER — PHENYLEPHRINE 40 MCG/ML (10ML) SYRINGE FOR IV PUSH (FOR BLOOD PRESSURE SUPPORT)
PREFILLED_SYRINGE | INTRAVENOUS | Status: AC
  Filled 2017-02-09: qty 10

## 2017-02-09 MED ORDER — MAGNESIUM CITRATE PO SOLN: 1.0000 | Freq: Once | ORAL | Status: DC | PRN

## 2017-02-09 MED ORDER — TRANEXAMIC ACID 1000 MG/10ML IV SOLN
1000.0000 mg | Freq: Once | INTRAVENOUS | Status: AC
  Administered 2017-02-09: 1000 mg via INTRAVENOUS
  Filled 2017-02-09: qty 1100

## 2017-02-09 MED ORDER — DEXAMETHASONE SODIUM PHOSPHATE 10 MG/ML IJ SOLN
INTRAMUSCULAR | Status: DC | PRN
  Administered 2017-02-09: 10 mg via INTRAVENOUS

## 2017-02-09 MED ORDER — DORZOLAMIDE HCL 2 % OP SOLN
1.0000 [drp] | Freq: Three times a day (TID) | OPHTHALMIC | Status: DC
  Administered 2017-02-09 – 2017-02-11 (×6): 1 [drp] via OPHTHALMIC
  Filled 2017-02-09: qty 10

## 2017-02-09 MED ORDER — ALPRAZOLAM 0.5 MG PO TABS
0.5000 mg | ORAL_TABLET | Freq: Two times a day (BID) | ORAL | Status: DC
Start: 2017-02-09 — End: 2017-02-11
  Administered 2017-02-09 – 2017-02-11 (×4): 0.5 mg via ORAL
  Filled 2017-02-09 (×4): qty 1

## 2017-02-09 MED ORDER — FENTANYL CITRATE (PF) 100 MCG/2ML IJ SOLN
INTRAMUSCULAR | Status: DC | PRN
  Administered 2017-02-09: 100 ug via INTRAVENOUS
  Administered 2017-02-09 (×2): 50 ug via INTRAVENOUS
  Administered 2017-02-09 (×2): 100 ug via INTRAVENOUS
  Administered 2017-02-09: 50 ug via INTRAVENOUS

## 2017-02-09 MED ORDER — ALUM & MAG HYDROXIDE-SIMETH 200-200-20 MG/5ML PO SUSP
15.0000 mL | ORAL | Status: DC | PRN
  Filled 2017-02-09: qty 30

## 2017-02-09 MED ORDER — DIPHENHYDRAMINE HCL 25 MG PO CAPS: 25.0000 mg | ORAL_CAPSULE | Freq: Four times a day (QID) | ORAL | Status: DC | PRN

## 2017-02-09 MED ORDER — FENTANYL CITRATE (PF) 100 MCG/2ML IJ SOLN
INTRAMUSCULAR | Status: AC
  Filled 2017-02-09: qty 2

## 2017-02-09 MED ORDER — ONDANSETRON HCL 4 MG/2ML IJ SOLN
INTRAMUSCULAR | Status: DC | PRN
  Administered 2017-02-09: 4 mg via INTRAVENOUS

## 2017-02-09 MED ORDER — CEFAZOLIN SODIUM-DEXTROSE 2-4 GM/100ML-% IV SOLN
INTRAVENOUS | Status: AC
  Filled 2017-02-09: qty 100

## 2017-02-09 MED ORDER — PROPOFOL 10 MG/ML IV BOLUS
INTRAVENOUS | Status: DC | PRN
  Administered 2017-02-09: 30 mg via INTRAVENOUS
  Administered 2017-02-09: 170 mg via INTRAVENOUS

## 2017-02-09 MED ORDER — PHENOL 1.4 % MT LIQD: 1.0000 | OROMUCOSAL | Status: DC | PRN

## 2017-02-09 MED ORDER — PHENYLEPHRINE HCL 10 MG/ML IJ SOLN
INTRAMUSCULAR | Status: AC
  Filled 2017-02-09: qty 1

## 2017-02-09 MED ORDER — METHOCARBAMOL 500 MG PO TABS
500.0000 mg | ORAL_TABLET | Freq: Four times a day (QID) | ORAL | Status: DC | PRN
  Administered 2017-02-10: 18:00:00 500 mg via ORAL
  Filled 2017-02-09: qty 1

## 2017-02-09 MED ORDER — LACTATED RINGERS IV SOLN
INTRAVENOUS | Status: DC
  Administered 2017-02-09: 13:00:00 via INTRAVENOUS

## 2017-02-09 MED ORDER — ROCURONIUM BROMIDE 50 MG/5ML IV SOSY
PREFILLED_SYRINGE | INTRAVENOUS | Status: AC
  Filled 2017-02-09: qty 5

## 2017-02-09 MED ORDER — ONDANSETRON HCL 4 MG/2ML IJ SOLN: 4.0000 mg | Freq: Once | INTRAMUSCULAR | Status: DC | PRN

## 2017-02-09 MED ORDER — FUROSEMIDE 20 MG PO TABS
20.0000 mg | ORAL_TABLET | Freq: Every day | ORAL | Status: DC | PRN
  Administered 2017-02-09: 20 mg via ORAL
  Filled 2017-02-09: qty 1

## 2017-02-09 MED ORDER — CHLORHEXIDINE GLUCONATE 4 % EX LIQD: 60.0000 mL | Freq: Once | CUTANEOUS | Status: DC

## 2017-02-09 MED ORDER — PHENYLEPHRINE HCL 10 MG/ML IJ SOLN: INTRAMUSCULAR | Status: DC | PRN

## 2017-02-09 MED ORDER — POLYETHYLENE GLYCOL 3350 17 G PO PACK: 17.0000 g | PACK | Freq: Two times a day (BID) | ORAL | 0 refills | Status: AC

## 2017-02-09 MED ORDER — FERROUS SULFATE 325 (65 FE) MG PO TABS: 325.0000 mg | ORAL_TABLET | Freq: Three times a day (TID) | ORAL | Status: AC

## 2017-02-09 MED ORDER — SODIUM CHLORIDE 0.9 % IR SOLN
Status: DC | PRN
  Administered 2017-02-09: 1000 mL

## 2017-02-09 MED ORDER — MENTHOL 3 MG MT LOZG: 1.0000 | LOZENGE | OROMUCOSAL | Status: DC | PRN

## 2017-02-09 MED ORDER — RIVAROXABAN 10 MG PO TABS
10.0000 mg | ORAL_TABLET | ORAL | Status: DC
  Administered 2017-02-10 – 2017-02-11 (×2): 10 mg via ORAL
  Filled 2017-02-09 (×2): qty 1

## 2017-02-09 MED ORDER — DOCUSATE SODIUM 100 MG PO CAPS
100.0000 mg | ORAL_CAPSULE | Freq: Two times a day (BID) | ORAL | Status: DC
  Administered 2017-02-09 – 2017-02-11 (×4): 100 mg via ORAL
  Filled 2017-02-09 (×3): qty 1

## 2017-02-09 MED ORDER — SODIUM CHLORIDE 0.9 % IV SOLN
1000.0000 mg | INTRAVENOUS | Status: AC
  Administered 2017-02-09: 1000 mg via INTRAVENOUS
  Filled 2017-02-09: qty 1100

## 2017-02-09 MED ORDER — POLYETHYLENE GLYCOL 3350 17 G PO PACK
17.0000 g | PACK | Freq: Two times a day (BID) | ORAL | Status: DC
  Administered 2017-02-10 – 2017-02-11 (×2): 17 g via ORAL
  Filled 2017-02-09 (×2): qty 1

## 2017-02-09 MED ORDER — METHOCARBAMOL 500 MG PO TABS: 500.0000 mg | ORAL_TABLET | Freq: Four times a day (QID) | ORAL | 0 refills | Status: AC | PRN

## 2017-02-09 MED ORDER — FERROUS SULFATE 325 (65 FE) MG PO TABS
325.0000 mg | ORAL_TABLET | Freq: Three times a day (TID) | ORAL | Status: DC
  Administered 2017-02-10 – 2017-02-11 (×4): 325 mg via ORAL
  Filled 2017-02-09 (×4): qty 1

## 2017-02-09 MED ORDER — RIVAROXABAN 10 MG PO TABS: 10.0000 mg | ORAL_TABLET | Freq: Every day | ORAL | 0 refills | Status: AC

## 2017-02-09 MED ORDER — LIDOCAINE 2% (20 MG/ML) 5 ML SYRINGE
INTRAMUSCULAR | Status: AC
  Filled 2017-02-09: qty 5

## 2017-02-09 MED ORDER — SUCCINYLCHOLINE CHLORIDE 200 MG/10ML IV SOSY
PREFILLED_SYRINGE | INTRAVENOUS | Status: AC
  Filled 2017-02-09: qty 10

## 2017-02-09 MED ORDER — BISACODYL 10 MG RE SUPP: 10.0000 mg | Freq: Every day | RECTAL | Status: DC | PRN

## 2017-02-09 MED ORDER — PHENYLEPHRINE HCL 10 MG/ML IJ SOLN
INTRAMUSCULAR | Status: DC | PRN
  Administered 2017-02-09: 80 ug via INTRAVENOUS
  Administered 2017-02-09 (×2): 120 ug via INTRAVENOUS
  Administered 2017-02-09: 80 ug via INTRAVENOUS

## 2017-02-09 MED ORDER — METOCLOPRAMIDE HCL 5 MG PO TABS: 5.0000 mg | ORAL_TABLET | Freq: Three times a day (TID) | ORAL | Status: DC | PRN

## 2017-02-09 MED ORDER — OXYCODONE HCL 5 MG PO TABS: 5.0000 mg | ORAL_TABLET | Freq: Once | ORAL | Status: DC | PRN

## 2017-02-09 MED ORDER — SODIUM CHLORIDE 0.9 % IV SOLN
INTRAVENOUS | Status: DC
  Administered 2017-02-09: 15:00:00 via INTRAVENOUS

## 2017-02-09 MED ORDER — HYDROCODONE-ACETAMINOPHEN 7.5-325 MG PO TABS: 1.0000 | ORAL_TABLET | ORAL | 0 refills | Status: AC | PRN

## 2017-02-09 MED ORDER — PHENYLEPHRINE HCL 10 MG/ML IJ SOLN
INTRAVENOUS | Status: DC | PRN
  Administered 2017-02-09 (×2): 50 ug/min via INTRAVENOUS

## 2017-02-09 MED ORDER — FENTANYL CITRATE (PF) 100 MCG/2ML IJ SOLN
INTRAMUSCULAR | Status: AC
  Administered 2017-02-09: 25 ug via INTRAVENOUS
  Filled 2017-02-09: qty 2

## 2017-02-09 SURGICAL SUPPLY — 35 items
BAG DECANTER FOR FLEXI CONT (MISCELLANEOUS) IMPLANT
BAG ZIPLOCK 12X15 (MISCELLANEOUS) IMPLANT
BLADE SAG 18X100X1.27 (BLADE) ×3 IMPLANT
CAPT HIP TOTAL 2 ×3 IMPLANT
CLOTH BEACON ORANGE TIMEOUT ST (SAFETY) ×3 IMPLANT
COVER PERINEAL POST (MISCELLANEOUS) ×3 IMPLANT
COVER SURGICAL LIGHT HANDLE (MISCELLANEOUS) ×3 IMPLANT
DERMABOND ADVANCED (GAUZE/BANDAGES/DRESSINGS) ×2
DERMABOND ADVANCED .7 DNX12 (GAUZE/BANDAGES/DRESSINGS) ×1 IMPLANT
DRAPE STERI IOBAN 125X83 (DRAPES) ×3 IMPLANT
DRAPE U-SHAPE 47X51 STRL (DRAPES) ×6 IMPLANT
DRESSING AQUACEL AG SP 3.5X10 (GAUZE/BANDAGES/DRESSINGS) ×1 IMPLANT
DRSG AQUACEL AG SP 3.5X10 (GAUZE/BANDAGES/DRESSINGS) ×3
DURAPREP 26ML APPLICATOR (WOUND CARE) ×3 IMPLANT
ELECT REM PT RETURN 15FT ADLT (MISCELLANEOUS) ×3 IMPLANT
GLOVE BIOGEL M STRL SZ7.5 (GLOVE) IMPLANT
GLOVE BIOGEL PI IND STRL 7.5 (GLOVE) ×5 IMPLANT
GLOVE BIOGEL PI IND STRL 8.5 (GLOVE) ×1 IMPLANT
GLOVE BIOGEL PI INDICATOR 7.5 (GLOVE) ×10
GLOVE BIOGEL PI INDICATOR 8.5 (GLOVE) ×2
GLOVE ECLIPSE 8.0 STRL XLNG CF (GLOVE) ×6 IMPLANT
GLOVE ORTHO TXT STRL SZ7.5 (GLOVE) ×3 IMPLANT
GOWN STRL REUS W/TWL LRG LVL3 (GOWN DISPOSABLE) ×6 IMPLANT
GOWN STRL REUS W/TWL XL LVL3 (GOWN DISPOSABLE) ×6 IMPLANT
HOLDER FOLEY CATH W/STRAP (MISCELLANEOUS) ×3 IMPLANT
PACK ANTERIOR HIP CUSTOM (KITS) ×3 IMPLANT
SUT MNCRL AB 4-0 PS2 18 (SUTURE) ×3 IMPLANT
SUT STRATAFIX 0 PDS 27 VIOLET (SUTURE) ×3
SUT VIC AB 1 CT1 36 (SUTURE) ×9 IMPLANT
SUT VIC AB 2-0 CT1 27 (SUTURE) ×4
SUT VIC AB 2-0 CT1 TAPERPNT 27 (SUTURE) ×2 IMPLANT
SUTURE STRATFX 0 PDS 27 VIOLET (SUTURE) ×1 IMPLANT
TRAY FOLEY CATH 14FR (SET/KITS/TRAYS/PACK) ×3 IMPLANT
WATER STERILE IRR 1500ML POUR (IV SOLUTION) ×6 IMPLANT
YANKAUER SUCT BULB TIP 10FT TU (MISCELLANEOUS) IMPLANT

## 2017-02-09 NOTE — Interval H&P Note (Signed)
History and Physical Interval Note:  02/09/2017 9:11 AM  Megan Burgess  has presented today for surgery, with the diagnosis of Right hip osteoarthritis  The various methods of treatment have been discussed with the patient and family. After consideration of risks, benefits and other options for treatment, the patient has consented to  Procedure(s): RIGHT TOTAL HIP ARTHROPLASTY ANTERIOR APPROACH (Right) as a surgical intervention .  The patient's history has been reviewed, patient examined, no change in status, stable for surgery.  I have reviewed the patient's chart and labs.  Questions were answered to the patient's satisfaction.     Mauri Pole

## 2017-02-09 NOTE — Anesthesia Postprocedure Evaluation (Addendum)
Anesthesia Post Note  Patient: Megan Burgess  Procedure(s) Performed: Procedure(s) (LRB): RIGHT TOTAL HIP ARTHROPLASTY ANTERIOR APPROACH (Right)  Patient location during evaluation: PACU Anesthesia Type: MAC Level of consciousness: awake, awake and alert and oriented Pain management: pain level controlled Vital Signs Assessment: post-procedure vital signs reviewed and stable Respiratory status: nonlabored ventilation, respiratory function stable and spontaneous breathing Cardiovascular status: blood pressure returned to baseline Anesthetic complications: no       Last Vitals:  Vitals:   02/09/17 1615 02/09/17 1808  BP: (!) 171/66 140/65  Pulse: 99 99  Resp: 14 14  Temp: 36.7 C 36.7 C    Last Pain:  Vitals:   02/09/17 1757  TempSrc:   PainSc: 2                  Krissa Utke COKER

## 2017-02-09 NOTE — Op Note (Signed)
NAME:  Megan Burgess                ACCOUNT NO.: [id number]      MEDICAL RECORD NO.: [id number]      FACILITY:  [facility]      PHYSICIAN:  Paralee Cancel D  DATE OF BIRTH:  April 15, 1946     DATE OF PROCEDURE:  02/09/2017                                 OPERATIVE REPORT         PREOPERATIVE DIAGNOSIS: Right  hip osteoarthritis.      POSTOPERATIVE DIAGNOSIS:  Right hip osteoarthritis.      PROCEDURE:  Right total hip replacement through an anterior approach   utilizing DePuy THR system, component size 81mm pinnacle cup, a size 36+4 neutral   Altrex liner, a size 2 Hi Tri Lock stem with a 36+1.5 delta ceramic   ball.      SURGEON:  Pietro Cassis. Alvan Dame, M.D.      ASSISTANT:  Danae Orleans, PA-C     ANESTHESIA:  General.      SPECIMENS:  None.      COMPLICATIONS:  None.      BLOOD LOSS:  600 cc     DRAINS:  None.      INDICATION OF THE PROCEDURE:  Megan Burgess is a 71 y.o. female who had   presented to office for evaluation of right hip pain.  Radiographs revealed   progressive degenerative changes with bone-on-bone   articulation to the  hip joint.  The patient had painful limited range of   motion significantly affecting their overall quality of life.  The patient was failing to    respond to conservative measures, and at this point was ready   to proceed with more definitive measures.  The patient has noted progressive   degenerative changes in his hip, progressive problems and dysfunction   with regarding the hip prior to surgery.  Consent was obtained for   benefit of pain relief.  Specific risk of infection, DVT, component   failure, dislocation, need for revision surgery, as well discussion of   the anterior versus posterior approach were reviewed.  Consent was   obtained for benefit of anterior pain relief through an anterior   approach.      PROCEDURE IN DETAIL:  The patient was brought to operative theater.   Once adequate anesthesia,  preoperative antibiotics, 2 gm of Ancef, 1 gm of Tranexamic Acid, and 10 mg of Decadron administered.   The patient was positioned supine on the OSI Hanna table.  Once adequate   padding of boney process was carried out, we had predraped out the hip, and  used fluoroscopy to confirm orientation of the pelvis and position.      The right hip was then prepped and draped from proximal iliac crest to   mid thigh with shower curtain technique.      Time-out was performed identifying the patient, planned procedure, and   extremity.     An incision was then made 2 cm distal and lateral to the   anterior superior iliac spine extending over the orientation of the   tensor fascia lata muscle and sharp dissection was carried down to the   fascia of the muscle and protractor placed in the soft tissues.      The fascia  was then incised.  The muscle belly was identified and swept   laterally and retractor placed along the superior neck.  Following   cauterization of the circumflex vessels and removing some pericapsular   fat, a second cobra retractor was placed on the inferior neck.  A third   retractor was placed on the anterior acetabulum after elevating the   anterior rectus.  A L-capsulotomy was along the line of the   superior neck to the trochanteric fossa, then extended proximally and   distally.  Tag sutures were placed and the retractors were then placed   intracapsular.  We then identified the trochanteric fossa and   orientation of my neck cut, confirmed this radiographically   and then made a neck osteotomy with the femur on traction.  The femoral   head was removed without difficulty or complication.  Traction was let   off and retractors were placed posterior and anterior around the   acetabulum.      The labrum and foveal tissue were debrided.  I began reaming with a 58mm   reamer and reamed up to 21mm reamer with good bony bed preparation and a 101mm   cup was chosen.  The final  64mm Pinnacle cup was then impacted under fluoroscopy  to confirm the depth of penetration and orientation with respect to   abduction.  A screw was placed followed by the hole eliminator.  The final   36+4 neutral Altrex liner was impacted with good visualized rim fit.  The cup was positioned anatomically within the acetabular portion of the pelvis.      At this point, the femur was rolled at 80 degrees.  Further capsule was   released off the inferior aspect of the femoral neck.  I then   released the superior capsule proximally.  The hook was placed laterally   along the femur and elevated manually and held in position with the bed   hook.  The leg was then extended and adducted with the leg rolled to 100   degrees of external rotation.  Once the proximal femur was fully   exposed, I used a box osteotome to set orientation.  I then began   broaching with the starting chili pepper broach and passed this by hand and then broached up to 2.  With the 2 broach in place I chose a high offset neck and did several trial reductions.  The offset was appropriate, leg lengths   appeared to be equal best matched with the +1.5 head ball, confirmed radiographically.   Given these findings, I went ahead and dislocated the hip, repositioned all   retractors and positioned the right hip in the extended and abducted position.  The final 2 Hi Tri Lock stem was   chosen and it was impacted down to the level of neck cut.  Based on this   and the trial reduction, a 36+1.5 delta ceramic ball was chosen and   impacted onto a clean and dry trunnion, and the hip was reduced.  The   hip had been irrigated throughout the case again at this point.  I did   reapproximate the superior capsular leaflet to the anterior leaflet   using #1 Vicryl.  The fascia of the   tensor fascia lata muscle was then reapproximated using #1 Vicryl and #0 Stratafix sutures.  The   remaining wound was closed with 2-0 Vicryl and running 4-0  Monocryl.   The hip was cleaned, dried,  and dressed sterilely using Dermabond and   Aquacel dressing.  She was then brought   to recovery room in stable condition tolerating the procedure well.    Danae Orleans, PA-C was present for the entirety of the case involved from   preoperative positioning, perioperative retractor management, general   facilitation of the case, as well as primary wound closure as assistant.            Pietro Cassis Alvan Dame, M.D.        02/09/2017 11:35 AM

## 2017-02-09 NOTE — Discharge Instructions (Addendum)

## 2017-02-09 NOTE — Progress Notes (Signed)
PT Cancellation Note  Patient Details Name: Megan Burgess MRN: [id number] DOB: [birth year]   Cancelled Treatment:    Reason Eval/Treat Not Completed: Patient not medically ready (BP a little up.)patient states she's not ready.   Claretha Cooper 02/09/2017, 6:02 PM

## 2017-02-09 NOTE — Transfer of Care (Signed)
Immediate Anesthesia Transfer of Care Note  Patient: Megan Burgess  Procedure(s) Performed: Procedure(s): RIGHT TOTAL HIP ARTHROPLASTY ANTERIOR APPROACH (Right)  Patient Location: PACU  Anesthesia Type:General  Level of Consciousness: awake, alert , oriented and patient cooperative  Airway & Oxygen Therapy: Patient Spontanous Breathing and Patient connected to face mask oxygen  Post-op Assessment: Report given to RN, Post -op Vital signs reviewed and stable and Patient moving all extremities X 4  Post vital signs: stable  Last Vitals:  Vitals:   02/09/17 0911 02/09/17 1210  BP: (!) 151/81 (!) (P) 165/68  Pulse: 80 (P) 91  Resp: 16 (P) 18  Temp: 36.8 C 36.6 C    Last Pain:  Vitals:   02/09/17 0919  TempSrc:   PainSc: 8       Patients Stated Pain Goal: 4 (94/32/00 3794)  Complications: No apparent anesthesia complications

## 2017-02-09 NOTE — Anesthesia Procedure Notes (Signed)
Procedure Name: Intubation Date/Time: 02/09/2017 10:12 AM Performed by: Lissa Morales Pre-anesthesia Checklist: Patient identified, Emergency Drugs available, Suction available and Patient being monitored Patient Re-evaluated:Patient Re-evaluated prior to inductionOxygen Delivery Method: Circle system utilized Preoxygenation: Pre-oxygenation with 100% oxygen Intubation Type: IV induction Ventilation: Mask ventilation without difficulty Laryngoscope Size: Mac and 4 Grade View: Grade II Tube type: Oral Tube size: 7.0 mm Number of attempts: 1 Airway Equipment and Method: Stylet and Oral airway Placement Confirmation: ETT inserted through vocal cords under direct vision,  positive ETCO2 and breath sounds checked- equal and bilateral Secured at: 21 cm Tube secured with: Tape Dental Injury: Teeth and Oropharynx as per pre-operative assessment

## 2017-02-09 NOTE — Anesthesia Preprocedure Evaluation (Addendum)
Anesthesia Evaluation  Patient identified by MRN, date of birth, ID band Patient awake    Reviewed: Allergy & Precautions, NPO status , Patient's Chart, lab work & pertinent test results  Airway Mallampati: II   Neck ROM: Full    Dental  (+) Teeth Intact, Dental Advisory Given   Pulmonary former smoker,    breath sounds clear to auscultation       Cardiovascular  Rhythm:Regular Rate:Normal     Neuro/Psych    GI/Hepatic   Endo/Other    Renal/GU      Musculoskeletal   Abdominal   Peds  Hematology   Anesthesia Other Findings   Reproductive/Obstetrics                            Anesthesia Physical Anesthesia Plan  ASA: III  Anesthesia Plan: MAC and Spinal   Post-op Pain Management:    Induction: Intravenous  Airway Management Planned: Natural Airway and Simple Face Mask  Additional Equipment:   Intra-op Plan:   Post-operative Plan:   Informed Consent: I have reviewed the patients History and Physical, chart, labs and discussed the procedure including the risks, benefits and alternatives for the proposed anesthesia with the patient or authorized representative who has indicated his/her understanding and acceptance.   Dental advisory given  Plan Discussed with: CRNA and Anesthesiologist  Anesthesia Plan Comments:        Anesthesia Quick Evaluation

## 2017-02-10 LAB — CBC
HCT: 34 % — ABNORMAL LOW (ref 36.0–46.0)
Hemoglobin: 11.5 g/dL — ABNORMAL LOW (ref 12.0–15.0)
MCH: 30.8 pg (ref 26.0–34.0)
MCHC: 33.8 g/dL (ref 30.0–36.0)
MCV: 91.2 fL (ref 78.0–100.0)
PLATELETS: 213 10*3/uL (ref 150–400)
RBC: 3.73 MIL/uL — AB (ref 3.87–5.11)
RDW: 13.5 % (ref 11.5–15.5)
WBC: 11.7 10*3/uL — ABNORMAL HIGH (ref 4.0–10.5)

## 2017-02-10 LAB — BASIC METABOLIC PANEL
ANION GAP: 7 (ref 5–15)
BUN: 12 mg/dL (ref 6–20)
CO2: 25 mmol/L (ref 22–32)
Calcium: 8.2 mg/dL — ABNORMAL LOW (ref 8.9–10.3)
Chloride: 105 mmol/L (ref 101–111)
Creatinine, Ser: 0.7 mg/dL (ref 0.44–1.00)
GFR calc Af Amer: >60 mL/min (ref >60)
Glucose, Bld: 126 mg/dL — ABNORMAL HIGH (ref 65–99)
POTASSIUM: 4.5 mmol/L (ref 3.5–5.1)
SODIUM: 137 mmol/L (ref 135–145)

## 2017-02-10 MED ORDER — OXYCODONE HCL 5 MG/5ML PO SOLN: 5.0000 mg | Freq: Once | ORAL | Status: DC | PRN

## 2017-02-10 MED ORDER — FENTANYL CITRATE (PF) 100 MCG/2ML IJ SOLN: 25.0000 ug | INTRAMUSCULAR | Status: DC | PRN

## 2017-02-10 MED ORDER — OXYCODONE HCL 5 MG PO TABS: 5.0000 mg | ORAL_TABLET | Freq: Once | ORAL | Status: DC | PRN

## 2017-02-10 MED ORDER — ONDANSETRON HCL 4 MG/2ML IJ SOLN: 4.0000 mg | Freq: Once | INTRAMUSCULAR | Status: DC | PRN

## 2017-02-10 MED ORDER — LIP MEDEX EX OINT
TOPICAL_OINTMENT | CUTANEOUS | Status: AC
  Administered 2017-02-10: 1
  Filled 2017-02-10: qty 7

## 2017-02-10 NOTE — Evaluation (Signed)
Physical Therapy Evaluation Patient Details Name: Megan Burgess MRN: [id number] DOB: 08-15-1946 Today's Date: 02/10/2017   History of Present Illness  R DATHA  Clinical Impression  The patient is progressing well. Plans to DC to home with family and friends assisting.  Pt admitted with above diagnosis. Pt currently with functional limitations due to the deficits listed below (see PT Problem List).  Pt will benefit from skilled PT to increase their independence and safety with mobility to allow discharge to the venue listed below.       Follow Up Recommendations Home health PT;Supervision/Assistance - 24 hour    Equipment Recommendations  None recommended by PT    Recommendations for Other Services       Precautions / Restrictions Precautions Precautions: Fall      Mobility  Bed Mobility Overal bed mobility: Needs Assistance Bed Mobility: Sit to Supine;Supine to Sit     Supine to sit: Min assist Sit to supine: Min assist   General bed mobility comments: cues for technique , assist right leg  Transfers Overall transfer level: Needs assistance Equipment used: Rolling walker (2 wheeled) Transfers: Sit to/from Stand Sit to Stand: Min assist         General transfer comment: cues for hand and right leg position.  Ambulation/Gait Ambulation/Gait assistance: Min assist Ambulation Distance (Feet): 120 Feet Assistive device: Rolling walker (2 wheeled) Gait Pattern/deviations: Step-to pattern;Step-through pattern;Antalgic     General Gait Details: cues for sequence  Stairs            Wheelchair Mobility    Modified Rankin (Stroke Patients Only)       Balance                                             Pertinent Vitals/Pain Pain Assessment: 0-10 Pain Score: 5  Pain Location: right hip Pain Descriptors / Indicators: Sore Pain Intervention(s): Monitored during session;Premedicated before session;Ice applied    Home Living  Family/patient expects to be discharged to:: Private residence Living Arrangements: Non-relatives/Friends;Other relatives;Children Available Help at Discharge: Available 24 hours/day Type of Home: Apartment Home Access: Stairs to enter Entrance Stairs-Rails: Right Entrance Stairs-Number of Steps: 5 Home Layout: One level Home Equipment: Shower seat;Grab bars - tub/shower Additional Comments: will get a CNA, various family and friends will stay.    Prior Function Level of Independence: Independent               Hand Dominance        Extremity/Trunk Assessment   Upper Extremity Assessment Upper Extremity Assessment: Defer to OT evaluation    Lower Extremity Assessment Lower Extremity Assessment: RLE deficits/detail RLE Deficits / Details: able to advance the leg    Cervical / Trunk Assessment Cervical / Trunk Assessment: Normal  Communication   Communication: No difficulties  Cognition Arousal/Alertness: Awake/alert Behavior During Therapy: WFL for tasks assessed/performed Overall Cognitive Status: Within Functional Limits for tasks assessed                                        General Comments      Exercises Total Joint Exercises Ankle Circles/Pumps: AROM;10 reps;Both Quad Sets: AROM;10 reps;Both Short Arc Quad: AROM;Right;10 reps Heel Slides: AAROM;Right;10 reps Hip ABduction/ADduction: AAROM;Right;10 reps Long Arc Quad: AAROM;Right;10 reps  Assessment/Plan    PT Assessment Patient needs continued PT services  PT Problem List Decreased strength;Decreased range of motion;Decreased activity tolerance;Decreased mobility;Decreased knowledge of precautions;Decreased knowledge of use of DME;Decreased safety awareness       PT Treatment Interventions DME instruction;Gait training;Stair training;Functional mobility training;Therapeutic activities;Therapeutic exercise;Patient/family education    PT Goals (Current goals can be found in the  Care Plan section)  Acute Rehab PT Goals Patient Stated Goal: to go home PT Goal Formulation: With patient Time For Goal Achievement: 02/13/17 Potential to Achieve Goals: Good    Frequency 7X/week   Barriers to discharge        Co-evaluation               AM-PAC PT "6 Clicks" Daily Activity  Outcome Measure Difficulty turning over in bed (including adjusting bedclothes, sheets and blankets)?: A Little Difficulty moving from lying on back to sitting on the side of the bed? : A Little Difficulty sitting down on and standing up from a chair with arms (e.g., wheelchair, bedside commode, etc,.)?: A Little Help needed moving to and from a bed to chair (including a wheelchair)?: A Little Help needed walking in hospital room?: A Little Help needed climbing 3-5 steps with a railing? : A Little 6 Click Score: 18    End of Session   Activity Tolerance: Patient tolerated treatment well Patient left: in bed;with call bell/phone within reach;with bed alarm set Nurse Communication: Mobility status PT Visit Diagnosis: Difficulty in walking, not elsewhere classified (R26.2)    Time: 8527-7824 PT Time Calculation (min) (ACUTE ONLY): 51 min   Charges:   PT Evaluation $PT Eval Low Complexity: 1 Procedure PT Treatments $Gait Training: 8-22 mins $Therapeutic Exercise: 8-22 mins   PT G CodesTresa Endo PT [phone] }  Claretha Cooper 02/10/2017, 12:59 PM

## 2017-02-10 NOTE — Progress Notes (Signed)
Spoke with patient at bedside. Discussed preop plan for no PT. Has RW and 3n1. Patient states she has arranged [place] with [facility], this was done through her insurance agent. They will come daily, no sure of how many hours per day. She has friends and family that will provide ongoing support. [phone]

## 2017-02-10 NOTE — Evaluation (Signed)
Occupational Therapy Evaluation Patient Details Name: Megan Burgess MRN: [id number] DOB: 1946/01/22 Today's Date: 02/10/2017    History of Present Illness R DA THA   Clinical Impression   This 71 y/o F presents with the above. At baseline Pt is independent with ADLs and functional mobility. Pt currently requires MinGuard assist for functional mobility and MinA for LB ADLs. Pt usually lives alone, but will have assistance from home healthcare aides and friends/neighbors after discharge with ADLs PRN. Pt reports feeling comfortable with completing ADLs upon return home. Education provided on AE and compensatory techniques for increasing safety and independence for completing ADLs. Questions answered throughout. No further OT needs identified at this time.     Follow Up Recommendations  No OT follow up;Supervision/Assistance - 24 hour    Equipment Recommendations  None recommended by OT           Precautions / Restrictions Precautions Precautions: Fall Restrictions Weight Bearing Restrictions: No RLE Weight Bearing: Weight bearing as tolerated      Mobility Bed Mobility Overal bed mobility: Needs Assistance Bed Mobility: Sit to Supine     Supine to sit: Min assist Sit to supine: Min guard   General bed mobility comments: Pt able to use leg lifter to bring LE into bed, close guard for safety   Transfers Overall transfer level: Needs assistance Equipment used: Rolling walker (2 wheeled) Transfers: Sit to/from Stand Sit to Stand: Min guard         General transfer comment: cues for hand and right leg position.    Balance                                           ADL either performed or assessed with clinical judgement   ADL Overall ADL's : Needs assistance/impaired Eating/Feeding: Independent;Sitting   Grooming: Wash/dry hands;Supervision/safety;Standing   Upper Body Bathing: Set up;Sitting   Lower Body Bathing: Minimal assistance;Sit  to/from stand   Upper Body Dressing : Set up;Sitting   Lower Body Dressing: Minimal assistance;Sit to/from stand;With adaptive equipment   Toilet Transfer: Supervision/safety;Ambulation;BSC;RW Toilet Transfer Details (indicate cue type and reason): BSC over toilet  Toileting- Clothing Manipulation and Hygiene: Supervision/safety;Sit to/from stand     Tub/Shower Transfer Details (indicate cue type and reason): educated on safe tub transfer technique; Pt plans to have someone with her when completing transfer  Functional mobility during ADLs: Supervision/safety;Rolling walker General ADL Comments: educated Pt on compensatory techniques and AE for completing ADLs, safe functional mobility transfer techniques                          Pertinent Vitals/Pain Pain Assessment: Faces Pain Score: 2  Faces Pain Scale: Hurts a little bit Pain Location: right hip Pain Descriptors / Indicators: Sore;Aching Pain Intervention(s): Limited activity within patient's tolerance;Monitored during session;Repositioned     Hand Dominance     Extremity/Trunk Assessment Upper Extremity Assessment Upper Extremity Assessment: Overall WFL for tasks assessed   Lower Extremity Assessment Lower Extremity Assessment: RLE deficits/detail RLE Deficits / Details: able to advance the leg   Cervical / Trunk Assessment Cervical / Trunk Assessment: Normal   Communication Communication Communication: No difficulties   Cognition Arousal/Alertness: Awake/alert Behavior During Therapy: WFL for tasks assessed/performed Overall Cognitive Status: Within Functional Limits for tasks assessed  General Comments                Home Living Family/patient expects to be discharged to:: Private residence Living Arrangements: Non-relatives/Friends;Other relatives;Children Available Help at Discharge: Available 24 hours/day Type of Home: Apartment Home Access:  Stairs to enter Entrance Stairs-Number of Steps: 5 Entrance Stairs-Rails: Right Home Layout: One level     Bathroom Shower/Tub: Teacher, early years/pre: Standard     Home Equipment: Shower seat;Grab bars - tub/shower;Bedside commode   Additional Comments: will get a CNA, various family and friends will stay.      Prior Functioning/Environment Level of Independence: Independent                 OT Problem List: Decreased strength;Decreased activity tolerance;Decreased knowledge of use of DME or AE      OT Treatment/Interventions:      OT Goals(Current goals can be found in the care plan section) Acute Rehab OT Goals Patient Stated Goal: to go home OT Goal Formulation: With patient                                 AM-PAC PT "6 Clicks" Daily Activity     Outcome Measure Help from another person eating meals?: None Help from another person taking care of personal grooming?: A Little Help from another person toileting, which includes using toliet, bedpan, or urinal?: A Little Help from another person bathing (including washing, rinsing, drying)?: A Little Help from another person to put on and taking off regular upper body clothing?: A Little Help from another person to put on and taking off regular lower body clothing?: A Little 6 Click Score: 19   End of Session Equipment Utilized During Treatment: Rolling walker Nurse Communication: Mobility status  Activity Tolerance: Patient tolerated treatment well Patient left: in bed;with call bell/phone within reach  OT Visit Diagnosis: Muscle weakness (generalized) (M62.81)                Time: 9604-5409 OT Time Calculation (min): 22 min Charges:  OT General Charges $OT Visit: 1 Procedure OT Evaluation $OT Eval Low Complexity: 1 Procedure G-Codes:     Lou Cal, OT Pager [phone] 02/10/2017   Raymondo Band 02/10/2017, 2:53 PM

## 2017-02-10 NOTE — Progress Notes (Signed)
Physical Therapy Treatment Patient Details Name: Megan Burgess MRN: [id number] DOB: [birth year] Today's Date: 02/10/2017    History of Present Illness R DATHA    PT Comments    The patient is progressing well. Plans DC tomorrow.   Follow Up Recommendations  Home health PT;Supervision/Assistance - 24 hour     Equipment Recommendations  None recommended by PT    Recommendations for Other Services       Precautions / Restrictions Precautions Precautions: Fall Restrictions RLE Weight Bearing: Weight bearing as tolerated    Mobility  Bed Mobility Overal bed mobility: Needs Assistance Bed Mobility: Sit to Supine;Supine to Sit     Supine to sit: Min assist Sit to supine: Min assist   General bed mobility comments: patient used leg lifter and rail, encouraged to not use rail. moved the right leg without assistance using leg lifter  Transfers Overall transfer level: Needs assistance Equipment used: Rolling walker (2 wheeled) Transfers: Sit to/from Stand Sit to Stand: Min assist         General transfer comment: cues for hand and right leg position.  Ambulation/Gait Ambulation/Gait assistance: Supervision Ambulation Distance (Feet): 300 Feet Assistive device: Rolling walker (2 wheeled) Gait Pattern/deviations: Step-through pattern     General Gait Details: cues for sequence   Stairs            Wheelchair Mobility    Modified Rankin (Stroke Patients Only)       Balance                                            Cognition Arousal/Alertness: Awake/alert Behavior During Therapy: WFL for tasks assessed/performed Overall Cognitive Status: Within Functional Limits for tasks assessed                                        Exercises Total Joint Exercises Ankle Circles/Pumps: AROM;10 reps;Both Quad Sets: AROM;10 reps;Both Short Arc Quad: AROM;Right;10 reps Heel Slides: AAROM;Right;10 reps Hip ABduction/ADduction:  AAROM;Right;10 reps Long Arc Quad: AAROM;Right;10 reps    General Comments        Pertinent Vitals/Pain Pain Score: 2  Pain Location: right hip Pain Descriptors / Indicators: Sore Pain Intervention(s): Premedicated before session    Home Living Family/patient expects to be discharged to:: Private residence Living Arrangements: Non-relatives/Friends;Other relatives;Children Available Help at Discharge: Available 24 hours/day           Additional Comments: will get a CNA, various family and friends will stay.    Prior Function            PT Goals (current goals can now be found in the care plan section) Acute Rehab PT Goals Patient Stated Goal: to go home PT Goal Formulation: With patient Time For Goal Achievement: 02/13/17 Potential to Achieve Goals: Good Progress towards PT goals: Progressing toward goals    Frequency    7X/week      PT Plan Current plan remains appropriate    Co-evaluation              AM-PAC PT "6 Clicks" Daily Activity  Outcome Measure  Difficulty turning over in bed (including adjusting bedclothes, sheets and blankets)?: A Little Difficulty moving from lying on back to sitting on the side of the bed? : A Little Difficulty sitting  down on and standing up from a chair with arms (e.g., wheelchair, bedside commode, etc,.)?: A Little Help needed moving to and from a bed to chair (including a wheelchair)?: A Little Help needed walking in hospital room?: A Little Help needed climbing 3-5 steps with a railing? : A Little 6 Click Score: 18    End of Session   Activity Tolerance: Patient tolerated treatment well Patient left:  (w/ OT) Nurse Communication: Mobility status PT Visit Diagnosis: Difficulty in walking, not elsewhere classified (R26.2)     Time: 7628-3151 PT Time Calculation (min) (ACUTE ONLY): 26 min  Charges:  $Gait Training: 23-37 mins $    G Codes:        Claretha Cooper 02/10/2017, 2:25 PM

## 2017-02-10 NOTE — Progress Notes (Signed)
     Subjective: 1 Day Post-Op Procedure(s) (LRB): RIGHT TOTAL HIP ARTHROPLASTY ANTERIOR APPROACH (Right)   Patient reports pain as mild, pain controlled.  No events throughout the night.  Looking forward to working with PT and progressing with the hip.   Objective:   VITALS:   Vitals:   02/10/17 0141 02/10/17 0534  BP: 130/72 (!) 144/68  Pulse: 88 88  Resp: 14 14  Temp: 97.9 F (36.6 C) 97.6 F (36.4 C)    Dorsiflexion/Plantar flexion intact Incision: dressing C/D/I No cellulitis present Compartment soft  LABS  Recent Labs  02/09/17 1133 02/10/17 0418  HGB 11.2* 11.5*  HCT 33.0* 34.0*  WBC  --  11.7*  PLT  --  213     Recent Labs  02/09/17 1133 02/10/17 0418  NA 138 137  K 4.0 4.5  BUN  --  12  CREATININE  --  0.70  GLUCOSE 132* 126*     Assessment/Plan: 1 Day Post-Op Procedure(s) (LRB): RIGHT TOTAL HIP ARTHROPLASTY ANTERIOR APPROACH (Right) Foley cath d/c'ed Advance diet Up with therapy D/C IV fluids Discharge home, eventually when ready Plan for discharge tomorrow due to need for inpatient therapy to meet goal of being discharged home safely with family/caregiver.    Obese (BMI 30-39.9) Estimated body mass index is 32.45 kg/m as calculated from the following:   Height as of this encounter: 5\' 5"  (1.651 m).   Weight as of this encounter: 88.5 kg (195 lb). Patient also counseled that weight may inhibit the healing process Patient counseled that losing weight will help with future health issues      West Pugh. Cherish Runde   PAC  02/10/2017, 8:22 AM

## 2017-02-11 LAB — CBC
HEMATOCRIT: 33 % — AB (ref 36.0–46.0)
Hemoglobin: 11.4 g/dL — ABNORMAL LOW (ref 12.0–15.0)
MCH: 31.6 pg (ref 26.0–34.0)
MCHC: 34.5 g/dL (ref 30.0–36.0)
MCV: 91.4 fL (ref 78.0–100.0)
Platelets: 200 10*3/uL (ref 150–400)
RBC: 3.61 MIL/uL — ABNORMAL LOW (ref 3.87–5.11)
RDW: 13.5 % (ref 11.5–15.5)
WBC: 16.2 10*3/uL — AB (ref 4.0–10.5)

## 2017-02-11 LAB — BASIC METABOLIC PANEL
ANION GAP: 8 (ref 5–15)
BUN: 17 mg/dL (ref 6–20)
CO2: 25 mmol/L (ref 22–32)
CREATININE: 0.62 mg/dL (ref 0.44–1.00)
Calcium: 8.7 mg/dL — ABNORMAL LOW (ref 8.9–10.3)
Chloride: 105 mmol/L (ref 101–111)
Glucose, Bld: 124 mg/dL — ABNORMAL HIGH (ref 65–99)
Potassium: 3.8 mmol/L (ref 3.5–5.1)
SODIUM: 138 mmol/L (ref 135–145)

## 2017-02-11 NOTE — Progress Notes (Signed)
     Subjective: 2 Days Post-Op Procedure(s) (LRB): RIGHT TOTAL HIP ARTHROPLASTY ANTERIOR APPROACH (Right)   Patient reports pain as mild, pain controlled.  Feels that she did well with PT yesterday and looking forward to getting better. No events throughout the night.  Ready to be discharged home.   Objective:   VITALS:   Vitals:   02/10/17 2045 02/11/17 0555  BP: (!) 150/62 133/67  Pulse: 79 84  Resp: 15 15  Temp: 97.8 F (36.6 C) 97.9 F (36.6 C)    Dorsiflexion/Plantar flexion intact Incision: dressing C/D/I No cellulitis present Compartment soft  LABS  Recent Labs  02/09/17 1133 02/10/17 0418 02/11/17 0422  HGB 11.2* 11.5* 11.4*  HCT 33.0* 34.0* 33.0*  WBC  --  11.7* 16.2*  PLT  --  213 200     Recent Labs  02/09/17 1133 02/10/17 0418 02/11/17 0422  NA 138 137 138  K 4.0 4.5 3.8  BUN  --  12 17  CREATININE  --  0.70 0.62  GLUCOSE 132* 126* 124*     Assessment/Plan: 2 Days Post-Op Procedure(s) (LRB): RIGHT TOTAL HIP ARTHROPLASTY ANTERIOR APPROACH (Right) Up with therapy Discharge home Follow up in 2 weeks at [facility]. Follow up with OLIN,Shawnta Schlegel D in 2 weeks.  Contact information:  [facility] [address], [address] [place]   PAC  02/11/2017, 7:19 AM

## 2017-02-11 NOTE — Progress Notes (Signed)
RN reviewed discharge instructions with patient and family. All questions answered.   Paperwork and prescriptions given.   NT rolled patient down with all belongings to family car. 

## 2017-02-11 NOTE — Progress Notes (Signed)
Physical Therapy Treatment Patient Details Name: Megan Burgess MRN: [id number] DOB: 1946/05/08 Today's Date: 02/11/2017    History of Present Illness R DA THA    PT Comments    POD # 2 Assisted OOB to amb to bathroom then in hallway.  Practiced stairs then returned to room and performed/educated on THR TE's following HEP handout.  Instructed on proper tech, freq as well as use of ICE.   Follow Up Recommendations  Home health PT;Supervision/Assistance - 24 hour     Equipment Recommendations  None recommended by PT    Recommendations for Other Services       Precautions / Restrictions Precautions Precautions: Fall Restrictions Weight Bearing Restrictions: No RLE Weight Bearing: Weight bearing as tolerated    Mobility  Bed Mobility Overal bed mobility: Needs Assistance Bed Mobility: Supine to Sit     Supine to sit: Supervision;Min guard     General bed mobility comments: Pt able to use leg lifter to bring LE off bed, close guard for safety   Transfers Overall transfer level: Needs assistance Equipment used: Rolling walker (2 wheeled) Transfers: Sit to/from Stand Sit to Stand: Min guard         General transfer comment: cues for hand and right leg position plus increased time  Ambulation/Gait Ambulation/Gait assistance: Supervision Ambulation Distance (Feet): 185 Feet Assistive device: Rolling walker (2 wheeled) Gait Pattern/deviations: Step-through pattern Gait velocity: decreased   General Gait Details: cues for sequence and safety with turns   Stairs Stairs: Yes   Stair Management: One rail Right;Step to pattern;Forwards Number of Stairs: 4 General stair comments: performed well side stepping and using one rail, pt stated "this is how I do it".    Wheelchair Mobility    Modified Rankin (Stroke Patients Only)       Balance                                            Cognition Arousal/Alertness: Awake/alert Behavior  During Therapy: WFL for tasks assessed/performed Overall Cognitive Status: Within Functional Limits for tasks assessed                                        Exercises   Total Hip Replacement TE's 10 reps ankle pumps 10 reps knee presses 10 reps heel slides 10 reps SAQ's 10 reps ABD Followed by ICE     General Comments        Pertinent Vitals/Pain Pain Assessment: 0-10 Pain Score: 3  Pain Location: right hip Pain Descriptors / Indicators: Sore;Aching;Operative site guarding Pain Intervention(s): Monitored during session;Repositioned;Ice applied    Home Living                      Prior Function            PT Goals (current goals can now be found in the care plan section) Progress towards PT goals: Progressing toward goals    Frequency    7X/week      PT Plan Current plan remains appropriate    Co-evaluation              AM-PAC PT "6 Clicks" Daily Activity  Outcome Measure  Difficulty turning over in bed (including adjusting bedclothes, sheets and blankets)?: A Little Difficulty  moving from lying on back to sitting on the side of the bed? : A Little Difficulty sitting down on and standing up from a chair with arms (e.g., wheelchair, bedside commode, etc,.)?: A Little Help needed moving to and from a bed to chair (including a wheelchair)?: A Little Help needed walking in hospital room?: A Little Help needed climbing 3-5 steps with a railing? : A Little 6 Click Score: 18    End of Session Equipment Utilized During Treatment: Gait belt Activity Tolerance: Patient tolerated treatment well Patient left: in chair;with call bell/phone within reach;with family/visitor present Nurse Communication:  (pt ready for D/C to home) PT Visit Diagnosis: Difficulty in walking, not elsewhere classified (R26.2)     Time: 1030-1100 PT Time Calculation (min) (ACUTE ONLY): 30 min  Charges:  $Gait Training: 8-22 mins $Therapeutic Activity:  8-22 mins                    G Codes:       Rica Koyanagi  PTA [facility]  Acute  Rehab Pager      [phone]

## 2017-02-11 NOTE — Discharge Summary (Signed)
Physician Discharge Summary  Patient ID: Megan Burgess MRN: [id number] DOB/AGE: [birth year] 72 y.o.  Admit date: 02/09/2017 Discharge date:  02/11/2017  Procedures:  Procedure(s) (LRB): RIGHT TOTAL HIP ARTHROPLASTY ANTERIOR APPROACH (Right)  Attending Physician:  Dr. Paralee Cancel   Admission Diagnoses:    Right hip primary OA /pain  Discharge Diagnoses:  Principal Problem:   S/P right THA, AA  Past Medical History:  Diagnosis Date  . Anxiety   . Arthritis   . Cancer ([place])    Left breast 1990  . Complication of anesthesia    severe indigestion  . GERD (gastroesophageal reflux disease)   . Hyperlipidemia   . Hypothyroidism     HPI:    Megan Burgess, 71 y.o. female, has a history of pain and functional disability in the right hip(s) due to arthritis and patient has failed non-surgical conservative treatments for greater than 12 weeks to include NSAID's and/or analgesics, corticosteriod injections and activity modification.  Onset of symptoms was gradual starting 6 months ago with rapidlly worsening course since that time.The patient noted no past surgery on the right hip(s).  Patient currently rates pain in the right hip at 8 out of 10 with activity. Patient has worsening of pain with activity and weight bearing, trendelenberg gait, pain that interfers with activities of daily living and pain with passive range of motion. Patient has evidence of periarticular osteophytes and joint space narrowing by imaging studies. This condition presents safety issues increasing the risk of falls. There is no current active infection.   Risks, benefits and expectations were discussed with the patient.  Risks including but not limited to the risk of anesthesia, blood clots, nerve damage, blood vessel damage, failure of the prosthesis, infection and up to and including death.  Patient understand the risks, benefits and expectations and wishes to proceed with surgery.   PCP: Raelene Bott, MD    Discharged Condition: good  Hospital Course:  Patient underwent the above stated procedure on 02/09/2017. Patient tolerated the procedure well and brought to the recovery room in good condition and subsequently to the floor.  POD #1 BP: 144/68 ; Pulse: 88 ; Temp: 97.6 F (36.4 C) ; Resp: 14 Patient reports pain as mild, pain controlled.  No events throughout the night.  Looking forward to working with PT and progressing with the hip.  Dorsiflexion/plantar flexion intact, incision: dressing C/D/I, no cellulitis present and compartment soft.   LABS  Basename    HGB     11.5  HCT     34.0   POD #2  BP: 133/67 ; Pulse: 84 ; Temp: 97.9 F (36.6 C) ; Resp: 15 Patient reports pain as mild, pain controlled.  Feels that she did well with PT yesterday and looking forward to getting better. No events throughout the night.  Ready to be discharged home.  Dorsiflexion/plantar flexion intact, incision: dressing C/D/I, no cellulitis present and compartment soft.   LABS  Basename    HGB     11.4  HCT     33.0    Discharge Exam: General appearance: alert, cooperative and no distress Extremities: Homans sign is negative, no sign of DVT, no edema, redness or tenderness in the calves or thighs and no ulcers, gangrene or trophic changes  Disposition: Home with follow up in 2 weeks   Follow-up Information    Paralee Cancel, MD. Schedule an appointment as soon as possible for a visit in 2 week(s).   Specialty:  Orthopedic Surgery  Contact information: [address] [place] [address] [phone]           Discharge Instructions    Call MD / Call 911    Complete by:  As directed    If you experience chest pain or shortness of breath, CALL 911 and be transported to the hospital emergency room.  If you develope a fever above 101 F, pus (white drainage) or increased drainage or redness at the wound, or calf pain, call your surgeon's office.   Change dressing    Complete  by:  As directed    Maintain surgical dressing until follow up in the clinic. If the edges start to pull up, may reinforce with tape. If the dressing is no longer working, may remove and cover with gauze and tape, but must keep the area dry and clean.  Call with any questions or concerns.   Constipation Prevention    Complete by:  As directed    Drink plenty of fluids.  Prune juice may be helpful.  You may use a stool softener, such as Colace (over the counter) 100 mg twice a day.  Use MiraLax (over the counter) for constipation as needed.   Diet - low sodium heart healthy    Complete by:  As directed    Discharge instructions    Complete by:  As directed    Maintain surgical dressing until follow up in the clinic. If the edges start to pull up, may reinforce with tape. If the dressing is no longer working, may remove and cover with gauze and tape, but must keep the area dry and clean.  Follow up in 2 weeks at [facility]. Call with any questions or concerns.   Increase activity slowly as tolerated    Complete by:  As directed    Weight bearing as tolerated with assist device (walker, cane, etc) as directed, use it as long as suggested by your surgeon or therapist, typically at least 4-6 weeks.   TED hose    Complete by:  As directed    Use stockings (TED hose) for 2 weeks on both leg(s).  You may remove them at night for sleeping.      Allergies as of 02/11/2017      Reactions   Simvastatin    Bones hurt       Medication List    STOP taking these medications   acetaminophen 500 MG tablet Commonly known as:  TYLENOL     TAKE these medications   ALPRAZolam 0.5 MG tablet Commonly known as:  XANAX Take 0.5 mg by mouth 2 (two) times daily.   CALCIUM 600+D PO Take 1 tablet by mouth daily. Notes to patient:  HOME REGIMEN   docusate sodium 100 MG capsule Commonly known as:  COLACE Take 1 capsule (100 mg total) by mouth 2 (two) times daily.   dorzolamide 2 %  ophthalmic solution Commonly known as:  TRUSOPT Place 1 drop into both eyes 3 (three) times daily.   ferrous sulfate 325 (65 FE) MG tablet Commonly known as:  FERROUSUL Take 1 tablet (325 mg total) by mouth 3 (three) times daily with meals. What changed:  when to take this   fish oil-omega-3 fatty acids 1000 MG capsule Take 1 g by mouth 2 (two) times daily. Notes to patient:  Home regimen   Flax Seed Oil 1000 MG Caps Take 1,000 mg by mouth 2 (two) times daily. Notes to patient:  Home regimen  furosemide 20 MG tablet Commonly known as:  LASIX Take 20 mg by mouth daily as needed for fluid or edema.   HYDROcodone-acetaminophen 7.5-325 MG tablet Commonly known as:  NORCO Take 1-2 tablets by mouth every 4 (four) hours as needed for moderate pain or severe pain.   latanoprost 0.005 % ophthalmic solution Commonly known as:  XALATAN Place 1 drop into the right eye daily. What changed:  how to take this  when to take this   levothyroxine 112 MCG tablet Commonly known as:  SYNTHROID, LEVOTHROID Take 112 mcg by mouth daily before breakfast.   methocarbamol 500 MG tablet Commonly known as:  ROBAXIN Take 1 tablet (500 mg total) by mouth every 6 (six) hours as needed for muscle spasms.   polyethylene glycol packet Commonly known as:  MIRALAX / GLYCOLAX Take 17 g by mouth 2 (two) times daily.   POTASSIUM PO Take 1 tablet by mouth daily.   rivaroxaban 10 MG Tabs tablet Commonly known as:  XARELTO Take 1 tablet (10 mg total) by mouth daily.   SUPER B COMPLEX/C PO Take 1 tablet by mouth daily. Notes to patient:  Home regimen   UNISOM SLEEPGELS 50 MG Caps Generic drug:  DiphenhydrAMINE HCl (Sleep) Take 50 mg by mouth at bedtime. Notes to patient:  Home regimen   venlafaxine XR 37.5 MG 24 hr capsule Commonly known as:  EFFEXOR-XR Take 1 capsule (37.5 mg total) by mouth daily. Notes to patient:  Home regimen   Vitamin D3 2000 units capsule Take 2,000 Units by mouth  daily. Notes to patient:  Home regimen        Signed: West Pugh. Leslee Suire   PA-C  02/11/2017, 11:23 AM

## 2017-05-20 ENCOUNTER — Encounter (HOSPITAL_COMMUNITY): Payer: Self-pay | Admitting: Orthopedic Surgery

## 2017-05-20 NOTE — Addendum Note (Signed)
Addendum  created 05/20/17 1437 by Roberts Gaudy, MD   Sign clinical note

## 2017-06-02 ENCOUNTER — Encounter (HOSPITAL_COMMUNITY): Payer: Self-pay | Admitting: *Deleted

## 2017-06-02 ENCOUNTER — Other Ambulatory Visit: Payer: Self-pay | Admitting: Neurosurgery

## 2017-06-02 NOTE — Progress Notes (Signed)
Pt denies SOB, chest pain, and being under the care of a cardiologist. Pt denies having an echo but stated that a stress test and cardiac cath was performed > 10 years ago " everything was okay it was indigestion." Pt denies having a chest x ray within the last year. Pt denies recent labs. EKG tracing, LOV note and all cardiac studies requested from [facility]. Pt made aware to stop taking  Aspirin, vitamins, fish oil, flaxseed and herbal medications. Do not take any NSAIDs ie: Ibuprofen, Advil, Naproxen (Anaprox/Aleve), Motrin, BC and Goody Powder or any medication containing Aspirin. Pt verbalized understanding of all pre-op instructions.

## 2017-06-03 ENCOUNTER — Ambulatory Visit (HOSPITAL_COMMUNITY): Payer: Medicare Other | Admitting: Certified Registered"

## 2017-06-03 ENCOUNTER — Ambulatory Visit (HOSPITAL_COMMUNITY)
Admission: RE | Admit: 2017-06-03 | Discharge: 2017-06-03 | Disposition: A | Discharge status: Home / Self Care | Payer: Medicare Other | Source: Ambulatory Visit | Attending: Neurosurgery | Admitting: Neurosurgery

## 2017-06-03 ENCOUNTER — Encounter (HOSPITAL_COMMUNITY)
Admission: RE | Disposition: A | Discharge status: Home / Self Care | Payer: Self-pay | Source: Ambulatory Visit | Attending: Neurosurgery

## 2017-06-03 ENCOUNTER — Encounter (HOSPITAL_COMMUNITY): Payer: Self-pay | Admitting: Urology

## 2017-06-03 DIAGNOSIS — I10 Essential (primary) hypertension: Secondary | ICD-10-CM | POA: Insufficient documentation

## 2017-06-03 DIAGNOSIS — Z87891 Personal history of nicotine dependence: Secondary | ICD-10-CM | POA: Diagnosis not present

## 2017-06-03 DIAGNOSIS — T85890A Other specified complication of nervous system prosthetic devices, implants and grafts, initial encounter: Secondary | ICD-10-CM | POA: Diagnosis present

## 2017-06-03 DIAGNOSIS — Z79899 Other long term (current) drug therapy: Secondary | ICD-10-CM | POA: Insufficient documentation

## 2017-06-03 DIAGNOSIS — F419 Anxiety disorder, unspecified: Secondary | ICD-10-CM | POA: Insufficient documentation

## 2017-06-03 DIAGNOSIS — X58XXXA Exposure to other specified factors, initial encounter: Secondary | ICD-10-CM | POA: Insufficient documentation

## 2017-06-03 DIAGNOSIS — Y939 Activity, unspecified: Secondary | ICD-10-CM | POA: Diagnosis not present

## 2017-06-03 DIAGNOSIS — G8929 Other chronic pain: Secondary | ICD-10-CM | POA: Diagnosis not present

## 2017-06-03 DIAGNOSIS — K219 Gastro-esophageal reflux disease without esophagitis: Secondary | ICD-10-CM | POA: Insufficient documentation

## 2017-06-03 HISTORY — PX: SPINAL CORD STIMULATOR BATTERY EXCHANGE: SHX6202

## 2017-06-03 LAB — CBC
HCT: 45.1 % (ref 36.0–46.0)
Hemoglobin: 15 g/dL (ref 12.0–15.0)
MCH: 29.7 pg (ref 26.0–34.0)
MCHC: 33.3 g/dL (ref 30.0–36.0)
MCV: 89.3 fL (ref 78.0–100.0)
PLATELETS: 258 10*3/uL (ref 150–400)
RBC: 5.05 MIL/uL (ref 3.87–5.11)
RDW: 13.9 % (ref 11.5–15.5)
WBC: 8.3 10*3/uL (ref 4.0–10.5)

## 2017-06-03 LAB — BASIC METABOLIC PANEL
Anion gap: 9 (ref 5–15)
BUN: 15 mg/dL (ref 6–20)
CALCIUM: 9.4 mg/dL (ref 8.9–10.3)
CO2: 26 mmol/L (ref 22–32)
CREATININE: 0.79 mg/dL (ref 0.44–1.00)
Chloride: 105 mmol/L (ref 101–111)
GFR calc non Af Amer: >60 mL/min (ref >60)
GLUCOSE: 89 mg/dL (ref 65–99)
Potassium: 4.5 mmol/L (ref 3.5–5.1)
Sodium: 140 mmol/L (ref 135–145)

## 2017-06-03 SURGERY — SPINAL CORD STIMULATOR BATTERY EXCHANGE
Anesthesia: General | Site: Back

## 2017-06-03 MED ORDER — CEFAZOLIN SODIUM-DEXTROSE 2-3 GM-% IV SOLR
INTRAVENOUS | Status: DC | PRN
  Administered 2017-06-03: 2 g via INTRAVENOUS

## 2017-06-03 MED ORDER — EPHEDRINE 5 MG/ML INJ
INTRAVENOUS | Status: AC
  Filled 2017-06-03: qty 10

## 2017-06-03 MED ORDER — VANCOMYCIN HCL 1000 MG IV SOLR
INTRAVENOUS | Status: AC
  Filled 2017-06-03: qty 1000

## 2017-06-03 MED ORDER — PROPOFOL 10 MG/ML IV BOLUS
INTRAVENOUS | Status: AC
  Filled 2017-06-03: qty 20

## 2017-06-03 MED ORDER — BUPIVACAINE HCL (PF) 0.5 % IJ SOLN
INTRAMUSCULAR | Status: DC | PRN
  Administered 2017-06-03: 5 mL

## 2017-06-03 MED ORDER — LIDOCAINE 2% (20 MG/ML) 5 ML SYRINGE
INTRAMUSCULAR | Status: DC | PRN
  Administered 2017-06-03: 100 mg via INTRAVENOUS

## 2017-06-03 MED ORDER — SODIUM CHLORIDE 0.9 % IJ SOLN
INTRAMUSCULAR | Status: AC
  Filled 2017-06-03: qty 10

## 2017-06-03 MED ORDER — ONDANSETRON HCL 4 MG/2ML IJ SOLN
INTRAMUSCULAR | Status: DC | PRN
  Administered 2017-06-03: 4 mg via INTRAVENOUS

## 2017-06-03 MED ORDER — SUGAMMADEX SODIUM 200 MG/2ML IV SOLN
INTRAVENOUS | Status: DC | PRN
  Administered 2017-06-03: 100 mg via INTRAVENOUS

## 2017-06-03 MED ORDER — DEXAMETHASONE SODIUM PHOSPHATE 10 MG/ML IJ SOLN
INTRAMUSCULAR | Status: AC
  Filled 2017-06-03: qty 1

## 2017-06-03 MED ORDER — CEFAZOLIN SODIUM-DEXTROSE 2-4 GM/100ML-% IV SOLN
INTRAVENOUS | Status: AC
  Filled 2017-06-03: qty 100

## 2017-06-03 MED ORDER — LIDOCAINE-EPINEPHRINE 1 %-1:100000 IJ SOLN
INTRAMUSCULAR | Status: AC
  Filled 2017-06-03: qty 1

## 2017-06-03 MED ORDER — MIDAZOLAM HCL 2 MG/2ML IJ SOLN
INTRAMUSCULAR | Status: AC
  Filled 2017-06-03: qty 2

## 2017-06-03 MED ORDER — DEXAMETHASONE SODIUM PHOSPHATE 10 MG/ML IJ SOLN
INTRAMUSCULAR | Status: DC | PRN
  Administered 2017-06-03: 5 mg via INTRAVENOUS

## 2017-06-03 MED ORDER — LIDOCAINE 2% (20 MG/ML) 5 ML SYRINGE
INTRAMUSCULAR | Status: AC
  Filled 2017-06-03: qty 5

## 2017-06-03 MED ORDER — ONDANSETRON HCL 4 MG/2ML IJ SOLN
INTRAMUSCULAR | Status: AC
  Filled 2017-06-03: qty 2

## 2017-06-03 MED ORDER — EPHEDRINE SULFATE-NACL 50-0.9 MG/10ML-% IV SOSY
PREFILLED_SYRINGE | INTRAVENOUS | Status: DC | PRN
  Administered 2017-06-03: 10 mg via INTRAVENOUS
  Administered 2017-06-03: 15 mg via INTRAVENOUS

## 2017-06-03 MED ORDER — 0.9 % SODIUM CHLORIDE (POUR BTL) OPTIME
TOPICAL | Status: DC | PRN
  Administered 2017-06-03: 1000 mL

## 2017-06-03 MED ORDER — THROMBIN 5000 UNITS EX SOLR
CUTANEOUS | Status: AC
  Filled 2017-06-03: qty 10000

## 2017-06-03 MED ORDER — PROPOFOL 10 MG/ML IV BOLUS
INTRAVENOUS | Status: DC | PRN
  Administered 2017-06-03: 150 mg via INTRAVENOUS
  Administered 2017-06-03: 50 mg via INTRAVENOUS

## 2017-06-03 MED ORDER — VANCOMYCIN HCL 1000 MG IV SOLR
INTRAVENOUS | Status: DC | PRN
  Administered 2017-06-03: 1000 mg

## 2017-06-03 MED ORDER — LIDOCAINE-EPINEPHRINE 1 %-1:100000 IJ SOLN
INTRAMUSCULAR | Status: DC | PRN
  Administered 2017-06-03: 5 mL

## 2017-06-03 MED ORDER — BUPIVACAINE HCL (PF) 0.5 % IJ SOLN
INTRAMUSCULAR | Status: AC
  Filled 2017-06-03: qty 30

## 2017-06-03 MED ORDER — LACTATED RINGERS IV SOLN
INTRAVENOUS | Status: DC | PRN
  Administered 2017-06-03: 07:00:00 via INTRAVENOUS

## 2017-06-03 MED ORDER — PHENYLEPHRINE 40 MCG/ML (10ML) SYRINGE FOR IV PUSH (FOR BLOOD PRESSURE SUPPORT)
PREFILLED_SYRINGE | INTRAVENOUS | Status: DC | PRN
  Administered 2017-06-03 (×2): 120 ug via INTRAVENOUS

## 2017-06-03 MED ORDER — ROCURONIUM BROMIDE 10 MG/ML (PF) SYRINGE
PREFILLED_SYRINGE | INTRAVENOUS | Status: AC
  Filled 2017-06-03: qty 5

## 2017-06-03 MED ORDER — SUFENTANIL CITRATE 50 MCG/ML IV SOLN
INTRAVENOUS | Status: DC | PRN
  Administered 2017-06-03: 15 ug via INTRAVENOUS

## 2017-06-03 MED ORDER — MIDAZOLAM HCL 5 MG/5ML IJ SOLN
INTRAMUSCULAR | Status: DC | PRN
  Administered 2017-06-03: 2 mg via INTRAVENOUS

## 2017-06-03 MED ORDER — SUFENTANIL CITRATE 50 MCG/ML IV SOLN
INTRAVENOUS | Status: AC
  Filled 2017-06-03: qty 1

## 2017-06-03 MED ORDER — PHENYLEPHRINE 40 MCG/ML (10ML) SYRINGE FOR IV PUSH (FOR BLOOD PRESSURE SUPPORT)
PREFILLED_SYRINGE | INTRAVENOUS | Status: AC
  Filled 2017-06-03: qty 10

## 2017-06-03 MED ORDER — ROCURONIUM BROMIDE 10 MG/ML (PF) SYRINGE
PREFILLED_SYRINGE | INTRAVENOUS | Status: DC | PRN
  Administered 2017-06-03: 50 mg via INTRAVENOUS

## 2017-06-03 SURGICAL SUPPLY — 59 items
ADAPTER POCKET 2X4 ×3 IMPLANT
BLADE CLIPPER SURG (BLADE) IMPLANT
BUR MATCHSTICK NEURO 3.0 LAGG (BURR) IMPLANT
BUR PRECISION FLUTE 5.0 (BURR) ×3 IMPLANT
CARTRIDGE OIL MAESTRO DRILL (MISCELLANEOUS) IMPLANT
DECANTER SPIKE VIAL GLASS SM (MISCELLANEOUS) ×3 IMPLANT
DERMABOND ADVANCED (GAUZE/BANDAGES/DRESSINGS) ×2
DERMABOND ADVANCED .7 DNX12 (GAUZE/BANDAGES/DRESSINGS) ×1 IMPLANT
DIFFUSER DRILL AIR PNEUMATIC (MISCELLANEOUS) IMPLANT
DRAPE C-ARM 42X72 X-RAY (DRAPES) IMPLANT
DRAPE CAMERA VIDEO/LASER (DRAPES) ×3 IMPLANT
DRAPE INCISE IOBAN 85X60 (DRAPES) IMPLANT
DRAPE LAPAROTOMY 100X72X124 (DRAPES) ×3 IMPLANT
DRAPE POUCH INSTRU U-SHP 10X18 (DRAPES) ×3 IMPLANT
DRAPE SURG 17X23 STRL (DRAPES) ×3 IMPLANT
DRSG OPSITE POSTOP 3X4 (GAUZE/BANDAGES/DRESSINGS) ×3 IMPLANT
ELECT REM PT RETURN 9FT ADLT (ELECTROSURGICAL) ×3
ELECTRODE REM PT RTRN 9FT ADLT (ELECTROSURGICAL) ×1 IMPLANT
GAUZE SPONGE 4X4 16PLY XRAY LF (GAUZE/BANDAGES/DRESSINGS) IMPLANT
GLOVE BIO SURGEON STRL SZ8 (GLOVE) ×3 IMPLANT
GLOVE BIOGEL PI IND STRL 7.0 (GLOVE) ×3 IMPLANT
GLOVE BIOGEL PI IND STRL 8 (GLOVE) ×1 IMPLANT
GLOVE BIOGEL PI IND STRL 8.5 (GLOVE) ×1 IMPLANT
GLOVE BIOGEL PI INDICATOR 7.0 (GLOVE) ×6
GLOVE BIOGEL PI INDICATOR 8 (GLOVE) ×2
GLOVE BIOGEL PI INDICATOR 8.5 (GLOVE) ×2
GLOVE ECLIPSE 8.0 STRL XLNG CF (GLOVE) ×3 IMPLANT
GLOVE EXAM NITRILE LRG STRL (GLOVE) IMPLANT
GLOVE EXAM NITRILE XL STR (GLOVE) IMPLANT
GLOVE EXAM NITRILE XS STR PU (GLOVE) IMPLANT
GLOVE SURG SS PI 7.0 STRL IVOR (GLOVE) ×3 IMPLANT
GOWN STRL REUS W/ TWL LRG LVL3 (GOWN DISPOSABLE) ×1 IMPLANT
GOWN STRL REUS W/ TWL XL LVL3 (GOWN DISPOSABLE) ×1 IMPLANT
GOWN STRL REUS W/TWL 2XL LVL3 (GOWN DISPOSABLE) ×3 IMPLANT
GOWN STRL REUS W/TWL LRG LVL3 (GOWN DISPOSABLE) ×2
GOWN STRL REUS W/TWL XL LVL3 (GOWN DISPOSABLE) ×2
KIT BASIN OR (CUSTOM PROCEDURE TRAY) ×3 IMPLANT
KIT ROOM TURNOVER OR (KITS) ×3 IMPLANT
NEEDLE HYPO 25X1 1.5 SAFETY (NEEDLE) ×3 IMPLANT
NEURO GENER MRI  PRIMEADV (Neuro Prosthesis/Implant) ×2 IMPLANT
NEURO GENER MRI PRIMEADV (Neuro Prosthesis/Implant) ×1 IMPLANT
NS IRRIG 1000ML POUR BTL (IV SOLUTION) ×3 IMPLANT
OIL CARTRIDGE MAESTRO DRILL (MISCELLANEOUS)
PACK LAMINECTOMY NEURO (CUSTOM PROCEDURE TRAY) ×3 IMPLANT
PAD ARMBOARD 7.5X6 YLW CONV (MISCELLANEOUS) ×12 IMPLANT
PROGRAMMER PATIENT (MISCELLANEOUS) ×3 IMPLANT
SPONGE LAP 4X18 X RAY DECT (DISPOSABLE) IMPLANT
SPONGE SURGIFOAM ABS GEL SZ50 (HEMOSTASIS) IMPLANT
STAPLER SKIN PROX WIDE 3.9 (STAPLE) ×3 IMPLANT
SUT SILK 0 TIES 10X30 (SUTURE) IMPLANT
SUT SILK 2 0 FS (SUTURE) IMPLANT
SUT SILK 2 0 TIES 10X30 (SUTURE) IMPLANT
SUT VIC AB 0 CT1 18XCR BRD8 (SUTURE) ×1 IMPLANT
SUT VIC AB 0 CT1 8-18 (SUTURE) ×2
SUT VIC AB 2-0 CP2 18 (SUTURE) ×6 IMPLANT
SUT VIC AB 3-0 SH 8-18 (SUTURE) ×6 IMPLANT
TOWEL GREEN STERILE (TOWEL DISPOSABLE) ×3 IMPLANT
TOWEL GREEN STERILE FF (TOWEL DISPOSABLE) ×3 IMPLANT
WATER STERILE IRR 1000ML POUR (IV SOLUTION) ×3 IMPLANT

## 2017-06-03 NOTE — Op Note (Signed)
06/03/2017  8:41 AM  PATIENT:  Megan Burgess  71 y.o. 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 Patient is brought to OR for SCS IPG revision with pocket adapter.  Procedure: Following smooth and uncomplicated induction of general endotracheal anesthesia the patient was placed in a prone position on the Wilson frame. Her back was then prepped and draped in the usual sterile fashion with Betadine scrub and Duraprep. Area of planned incision was infiltrated with local lidocaine. The subcutaneous pocket to the right of midline was reopened and old IPG removed.  A pocket adapter was placed and new IPG was placed. The wounds were irrigated with Vancomycin and closed with 0, 2-0 and 3-0 Vicryl stitches and the wounds were dressed with Dermabond. Impedances were assessed and found to be correct. Patient was taken to recovery having tolerated procedure well counts were correct at the end of the case.   PLAN OF CARE: Discharge to home after PACU  PATIENT DISPOSITION:  PACU - hemodynamically stable.   Delay start of Pharmacological VTE agent (>24hrs) due to surgical blood loss or risk of bleeding: yes

## 2017-06-03 NOTE — Transfer of Care (Signed)
Immediate Anesthesia Transfer of Care Note  Patient: Megan Burgess  Procedure(s) Performed: Procedure(s): Implantable pulse generator change with pocket adapter (N/A)  Patient Location: PACU  Anesthesia Type:General  Level of Consciousness: awake, oriented and patient cooperative  Airway & Oxygen Therapy: Patient Spontanous Breathing and Patient connected to nasal cannula oxygen  Post-op Assessment: Report given to RN, Post -op Vital signs reviewed and stable and Patient moving all extremities  Post vital signs: Reviewed and stable  Last Vitals:  Vitals:   06/03/17 0640 06/03/17 0835  BP: (!) 153/72   Pulse: 73   Resp: 20   Temp: 36.6 C 36.5 C  SpO2: 95%     Last Pain: There were no vitals filed for this visit.    Patients Stated Pain Goal: 5 (83/33/83 2919)  Complications: No apparent anesthesia complications

## 2017-06-03 NOTE — Interval H&P Note (Signed)
History and Physical Interval Note:  06/03/2017 7:13 AM  Megan Burgess  has presented today for surgery, with the diagnosis of Spinal cord stimulator status  The various methods of treatment have been discussed with the patient and family. After consideration of risks, benefits and other options for treatment, the patient has consented to  Procedure(s) with comments: Implantable pulse generator revision for spinal cord stimulator (N/A) - Implantable pulse generator revision for spinal cord stimulator as a surgical intervention .  The patient's history has been reviewed, patient examined, no change in status, stable for surgery.  I have reviewed the patient's chart and labs.  Questions were answered to the patient's satisfaction.     Brandee Markin D

## 2017-06-03 NOTE — H&P (Signed)
Patient ID:   [id number] Patient: Megan Burgess  Date of Birth: 1945/10/26 Visit Type: Office Visit   Date: 05/05/2017 02:00 PM Provider: Marchia Meiers. Vertell Limber MD   This 71 year old female presents for pain.   History of Present Illness: 1.  pain  Patient presents to get her Medtronic spinal cord stimulator battery assessed. She reports she continues to use the spinal cord stimulator.         Medical/Surgical/Interim History Reviewed, no change.  Last detailed document date:06/24/2016.     Family History: Reviewed, no changes.  Last detailed document date:06/24/2016.   Social History: Reviewed, no changes. Last detailed document date: 06/24/2016.    MEDICATIONS(added, continued or stopped this visit): Started Medication Directions Instruction Stopped   alprazolam 0.5 mg tablet take 1 tablet by oral route 2 times every day as needed     Calcium 600 600 mg calcium (1,500 mg) tablet take 1 tablet by mouth daily     diphenhydramine 50 mg capsule take 1 capsule by oral route  every bedtime     dorzolamide 2 % eye drops instill 1 drop by ophthalmic route 2 times every day into affected eye(s)     Fish Oil Concentrate 1,000 mg capsule take 1 by oral route 2 times every day     flaxseed oil 1200 BUCCAL take 1 tablet by mouth daily     latanoprost 0.005 % eye drops instill 1 drop by ophthalmic route  every day into affected eye(s) in the evening     levothyroxine 112 mcg tablet take 1 tablet by oral route  every day     Super B Complex-Vitamin C tablet take 1 tablet by mouth daily     venlafaxine ER 37.5 mg tablet,extended release 24 hr take 1 tablet by oral route  every day     Vitamin B-12 2,500 mcg sublingual tablet take 1 tablet by mouth daily     Vitamin D3 2,000 unit capsule take 1 tablet by mouth daily       ALLERGIES: Ingredient Reaction Medication Name Comment  NO KNOWN ALLERGIES     No known allergies. Reviewed, no changes.    Vitals Date Temp F BP Pulse Ht In Wt  Lb BMI BSA Pain Score  05/05/2017  151/86 93 65 193 32.12  0/10      IMPRESSION Patient presents to get her spinal cord stimulator battery assessed. The impedances are not good in the lead that she does not use. The impedance is fine in the lead she uses. We are not going to change the electrode. Her battery will be replaced in September. She will continue to use a non-rechargeable battery.  Completed Orders (this encounter) Order Details Reason Side Interpretation Result Initial Treatment Date Region  Dietary management education, guidance, and counseling Encouraged to eat a well balanced diet and follow up with primary care physician.        Hypertension education Continue to monitor blood pressure.  If blood pressure remains elevated, contact your primary care physician         Assessment/Plan # Detail Type Description   1. Assessment Body mass index (BMI) 32.0-32.9, adult (O27.03).   Plan Orders Today's instructions / counseling include(s) Dietary management education, guidance, and counseling.       2. Assessment Elevated blood-pressure reading, w/o diagnosis of htn (R03.0).           Pain Management Plan Pain Scale: 0/10. Method: Numeric Pain Intensity Scale. Location: back. Onset: 05/30/2015.  Schedule battery replacement on 06/03/17 at [facility].  Orders: Instruction(s)/Education: Assessment Instruction  R03.0 Hypertension education  [id number] Dietary management education, guidance, and counseling             Provider:  Vertell Limber MD, Marchia Meiers 05/05/2017 2:14 PM  Dictation edited by: Lucita Lora    CC Providers: Erline Levine MD  [address] [place], [place] [address]              Electronically signed by Marchia Meiers. Vertell Limber MD on 05/05/2017 05:54 PM

## 2017-06-03 NOTE — Anesthesia Postprocedure Evaluation (Signed)
Anesthesia Post Note  Patient: HOPE BRANDENBURGER  Procedure(s) Performed: Procedure(s) (LRB): Implantable pulse generator change with pocket adapter (N/A)     Patient location during evaluation: PACU Anesthesia Type: General Level of consciousness: awake and alert Pain management: pain level controlled Vital Signs Assessment: post-procedure vital signs reviewed and stable Respiratory status: spontaneous breathing, nonlabored ventilation, respiratory function stable and patient connected to nasal cannula oxygen Cardiovascular status: blood pressure returned to baseline and stable Postop Assessment: no signs of nausea or vomiting Anesthetic complications: no    Last Vitals:  Vitals:   06/03/17 0900 06/03/17 0930  BP: 135/74 (!) 154/84  Pulse: 80 80  Resp: 13 16  Temp: (!) 36.2 C   SpO2: 99% 100%    Last Pain:  Vitals:   06/03/17 0930  PainSc: 0-No pain                 [name] DAVID

## 2017-06-03 NOTE — Anesthesia Preprocedure Evaluation (Addendum)
Anesthesia Evaluation  Patient identified by MRN, date of birth, ID band Patient awake    Reviewed: Allergy & Precautions, NPO status , Patient's Chart, lab work & pertinent test results  Airway Mallampati: II  TM Distance: >3 FB Neck ROM: Full    Dental  (+) Teeth Intact   Pulmonary former smoker,    Pulmonary exam normal        Cardiovascular Normal cardiovascular exam     Neuro/Psych Anxiety    GI/Hepatic GERD  Medicated and Controlled,  Endo/Other    Renal/GU      Musculoskeletal   Abdominal   Peds  Hematology   Anesthesia Other Findings   Reproductive/Obstetrics                           Anesthesia Physical Anesthesia Plan  ASA: II  Anesthesia Plan: General   Post-op Pain Management:    Induction: Intravenous  PONV Risk Score and Plan: 3 and Ondansetron, Dexamethasone and Midazolam  Airway Management Planned: Oral ETT  Additional Equipment: None  Intra-op Plan:   Post-operative Plan: Extubation in OR  Informed Consent: I have reviewed the patients History and Physical, chart, labs and discussed the procedure including the risks, benefits and alternatives for the proposed anesthesia with the patient or authorized representative who has indicated his/her understanding and acceptance.   Dental advisory given  Plan Discussed with: CRNA and Surgeon  Anesthesia Plan Comments:        Anesthesia Quick Evaluation

## 2017-06-03 NOTE — Anesthesia Procedure Notes (Signed)
Procedure Name: Intubation Date/Time: 06/03/2017 7:36 AM Performed by: Melina Copa, Venetia Prewitt R Pre-anesthesia Checklist: Patient identified, Emergency Drugs available, Suction available and Patient being monitored Patient Re-evaluated:Patient Re-evaluated prior to induction Oxygen Delivery Method: Circle System Utilized Preoxygenation: Pre-oxygenation with 100% oxygen Induction Type: IV induction Ventilation: Mask ventilation without difficulty Laryngoscope Size: Mac and 3 Grade View: Grade II Tube type: Oral Tube size: 7.5 mm Number of attempts: 1 Airway Equipment and Method: Stylet and Oral airway Placement Confirmation: ETT inserted through vocal cords under direct vision,  positive ETCO2 and breath sounds checked- equal and bilateral Secured at: 21 cm Tube secured with: Tape Dental Injury: Teeth and Oropharynx as per pre-operative assessment

## 2017-06-03 NOTE — Brief Op Note (Signed)
06/03/2017  8:41 AM  PATIENT:  Megan Burgess  71 y.o. 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 Patient is brought to OR for SCS IPG revision with pocket adapter.  Procedure: Following smooth and uncomplicated induction of general endotracheal anesthesia the patient was placed in a prone position on the Wilson frame. Her back was then prepped and draped in the usual sterile fashion with Betadine scrub and Duraprep. Area of planned incision was infiltrated with local lidocaine. The subcutaneous pocket to the right of midline was reopened and old IPG removed.  A pocket adapter was placed and new IPG was placed. The wounds were irrigated with Vancomycin and closed with 0, 2-0 and 3-0 Vicryl stitches and the wounds were dressed with Dermabond. Impedances were assessed and found to be correct. Patient was taken to recovery having tolerated procedure well counts were correct at the end of the case.   PLAN OF CARE: Discharge to home after PACU  PATIENT DISPOSITION:  PACU - hemodynamically stable.   Delay start of Pharmacological VTE agent (>24hrs) due to surgical blood loss or risk of bleeding: yes

## 2017-06-03 NOTE — Discharge Instructions (Signed)
Wound care: Keep clean, dry and intact. Take bandage off in 5 days. Follow-up in 3 weeks, call office. Resume medications, same as pre-op.

## 2017-06-04 ENCOUNTER — Encounter (HOSPITAL_COMMUNITY): Payer: Self-pay | Admitting: Neurosurgery

## 2017-07-31 IMAGING — DX DG HIP (WITH OR WITHOUT PELVIS) 1V PORT*R*
2 series · 2 of 2 positions shown · non-contrast
Comparison: November 25, 2016

CLINICAL DATA: Status post total hip replacement on the right

EXAM:
DG HIP (WITH OR WITHOUT PELVIS) 2 V PORT RIGHT

[hip x-table]
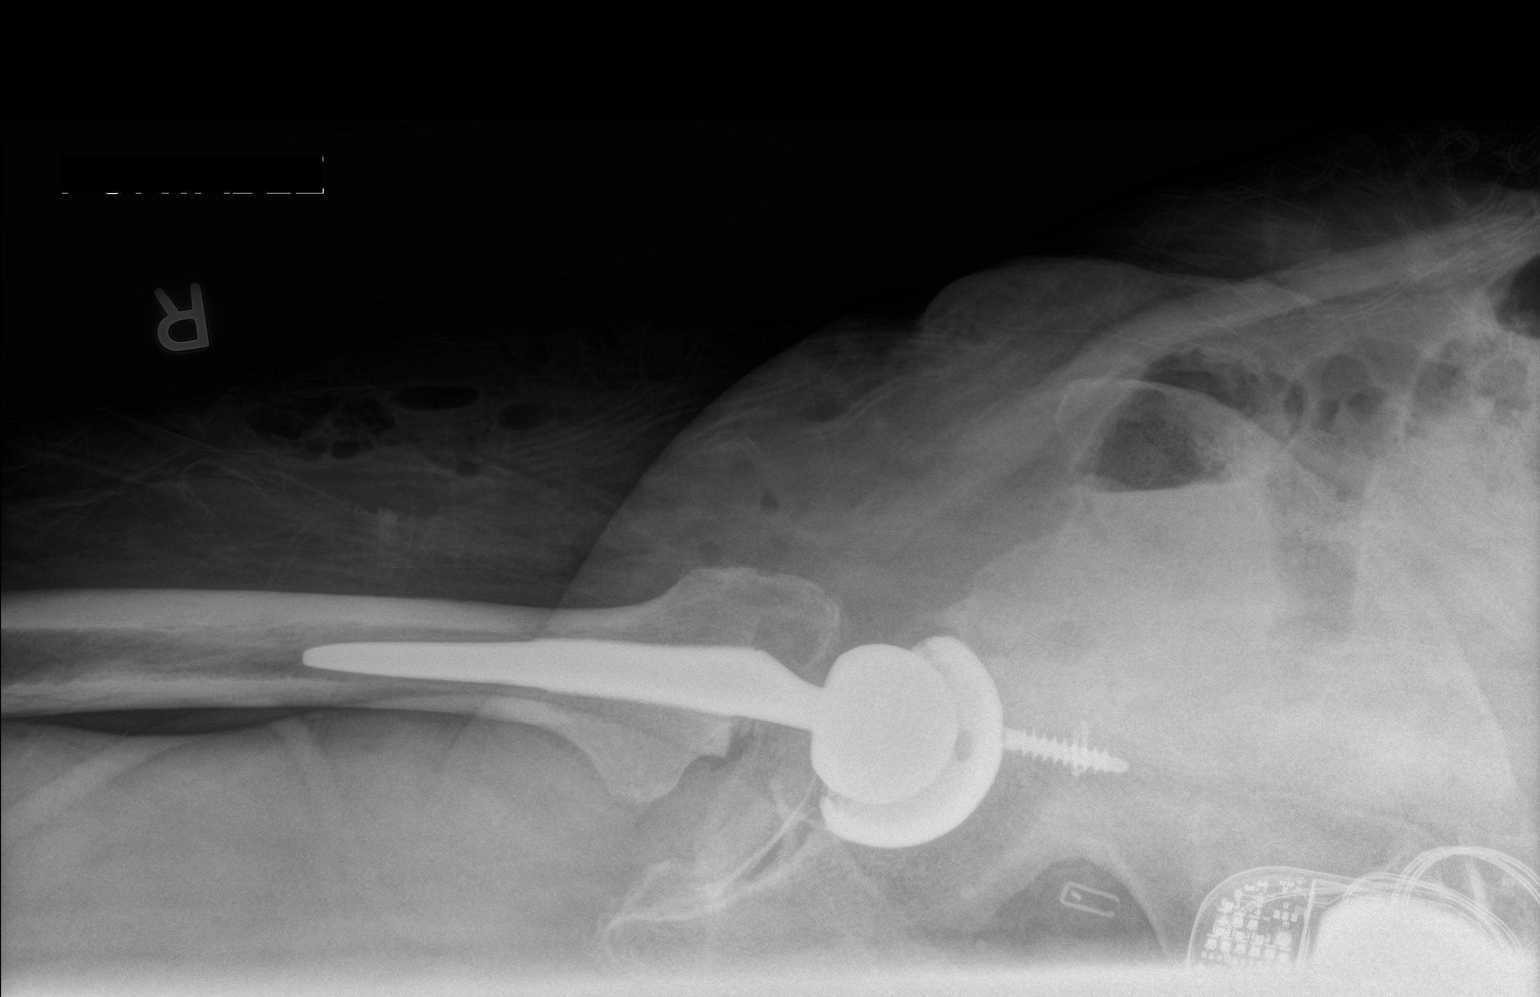

[pelvis ap]
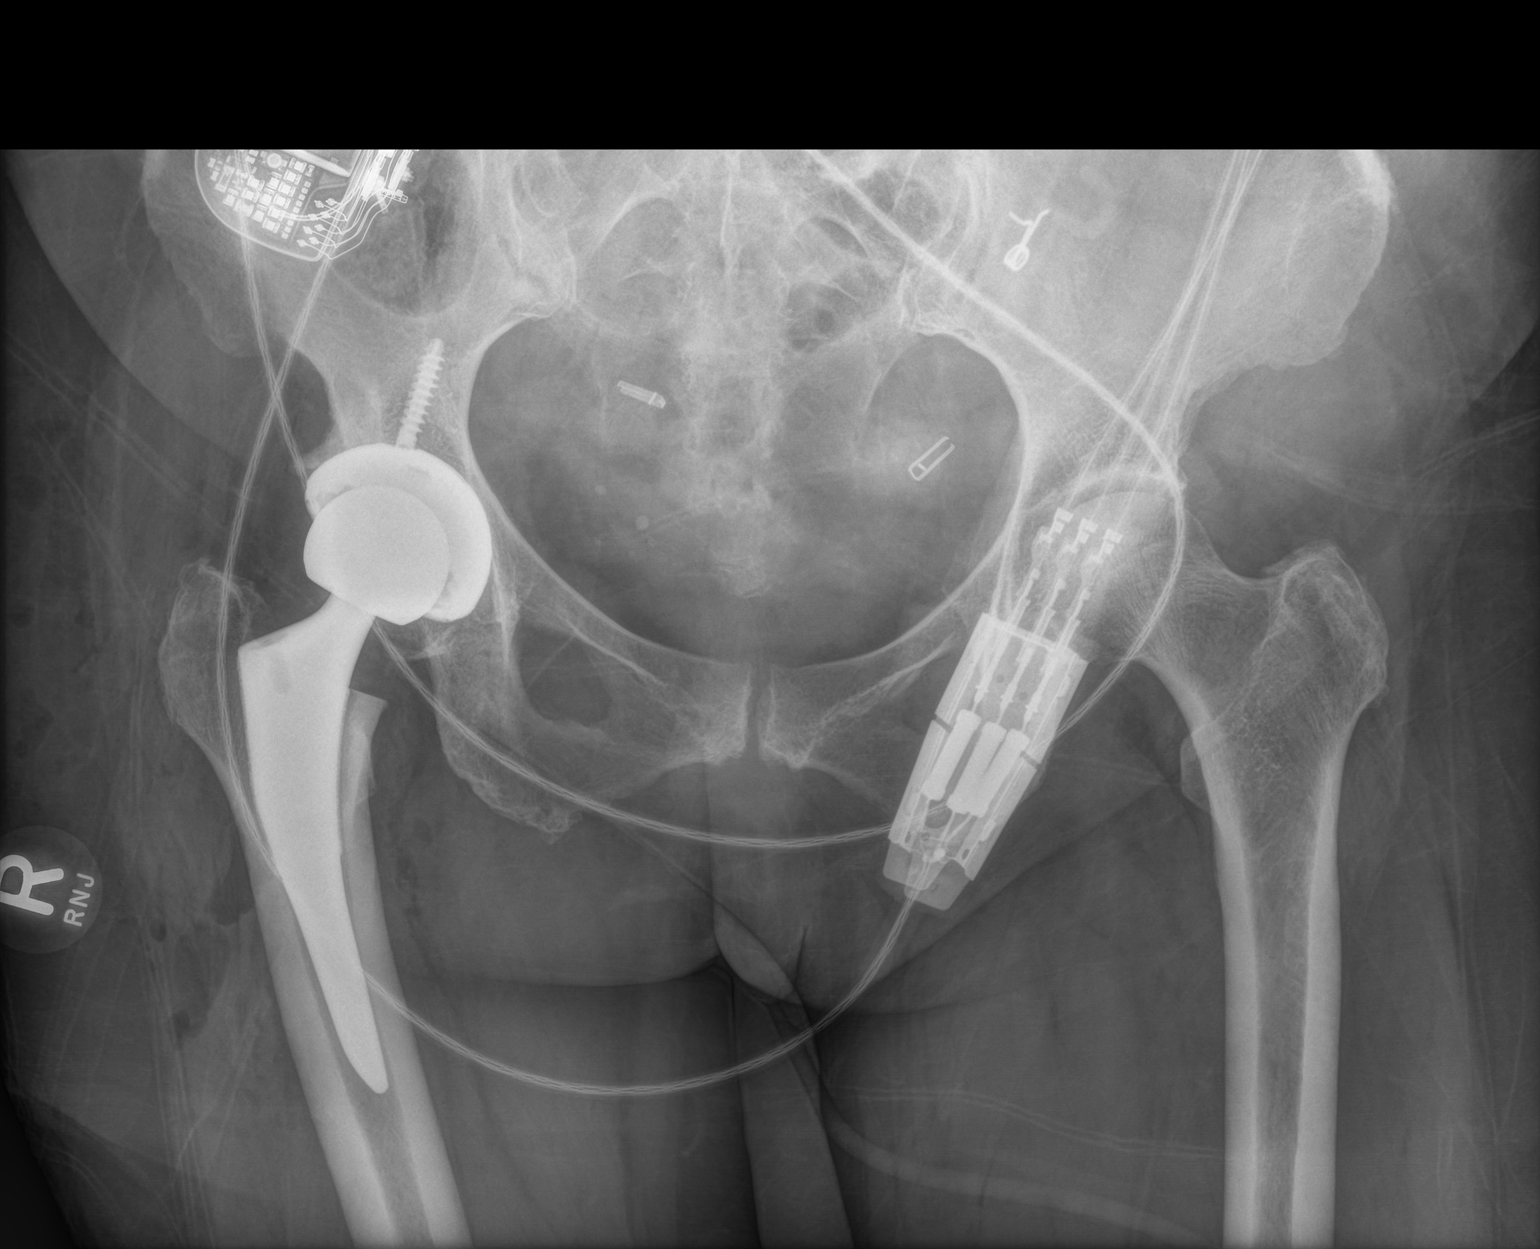

[2 of 2 positions shown; findings below may reference images not displayed]

FINDINGS: Frontal pelvis as well as lateral right hip images obtained. There
is a total hip replacement on the right with prosthetic components
well-seated. No acute fracture or dislocation. A stimulator overlies
the right iliac crest. Left hip joint appears unremarkable. Soft
tissue air on the right is an expected postoperative finding.
IMPRESSION: Total hip replacement right with prosthetic components well-seated.
No acute fracture or dislocation.

## 2019-09-28 DIAGNOSIS — M25562 Pain in left knee: Secondary | ICD-10-CM | POA: Insufficient documentation

## 2021-04-16 ENCOUNTER — Other Ambulatory Visit: Payer: Self-pay

## 2021-04-16 ENCOUNTER — Ambulatory Visit: Payer: Medicare Other | Admitting: Sports Medicine

## 2021-04-16 ENCOUNTER — Ambulatory Visit (INDEPENDENT_AMBULATORY_CARE_PROVIDER_SITE_OTHER): Payer: Medicare Other

## 2021-04-16 ENCOUNTER — Encounter: Payer: Self-pay | Admitting: Sports Medicine

## 2021-04-16 DIAGNOSIS — M779 Enthesopathy, unspecified: Secondary | ICD-10-CM

## 2021-04-16 DIAGNOSIS — D509 Iron deficiency anemia, unspecified: Secondary | ICD-10-CM | POA: Insufficient documentation

## 2021-04-16 DIAGNOSIS — M7662 Achilles tendinitis, left leg: Secondary | ICD-10-CM

## 2021-04-16 DIAGNOSIS — M79672 Pain in left foot: Secondary | ICD-10-CM | POA: Diagnosis not present

## 2021-04-16 DIAGNOSIS — M7661 Achilles tendinitis, right leg: Secondary | ICD-10-CM | POA: Diagnosis not present

## 2021-04-16 DIAGNOSIS — M79671 Pain in right foot: Secondary | ICD-10-CM

## 2021-04-16 DIAGNOSIS — M722 Plantar fascial fibromatosis: Secondary | ICD-10-CM

## 2021-04-16 MED ORDER — PREDNISONE 10 MG (21) PO TBPK: ORAL_TABLET | ORAL | 0 refills | Status: AC

## 2021-04-16 NOTE — Progress Notes (Signed)
Subjective: Megan Burgess is a 75 y.o. female patient who presents to office for evaluation of Right> Left heel pain. Patient complains of progressive pain especially over the last year in the Right>Left foot at the Achilles with pain that radiates to the plantar aspect of both feet and dorsal aspect of both feet with swelling. Ranks pain 7/10 and is now interferring with daily activities.  Pain is sharp in nature.  Patient has tried stretching with no relief in symptoms. Patient denies any other pedal complaints.   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   History of total right hip replacement 02/09/2017   RBBB 01/16/2017   Controlled substance agreement signed 07/06/2016   Vitamin B12 deficiency 02/10/2016   DDD (degenerative disc disease), lumbar 11/01/2015   Neuropathy 11/01/2015   Acquired hypothyroidism 10/16/2013   Back pain 10/16/2013   Glaucoma 10/16/2013   Hyperlipidemia 10/16/2013   ABNORMAL EKG 04/17/2009   HYPERCALCEMIA 04/12/2009   ANXIETY 06/20/2007   ALLERGIC RHINITIS 06/20/2007   GERD 06/20/2007   OSTEOARTHRITIS 06/20/2007   BREAST CANCER, HX OF 06/20/2007   COLONIC POLYPS, HX OF 06/20/2007    Current Outpatient Medications on File Prior to Visit  Medication Sig Dispense Refill   ALPRAZolam (XANAX) 0.5 MG tablet Take 0.5 mg by mouth 2 (two) times daily.     Calcium Carbonate-Vitamin D (CALCIUM 600+D PO) Take 1 tablet by mouth daily.     Cholecalciferol (VITAMIN D3) 2000 units capsule Take 2,000 Units by mouth daily.     DiphenhydrAMINE HCl, Sleep, (UNISOM SLEEPGELS) 50 MG CAPS Take 50 mg by mouth at bedtime.     docusate sodium (COLACE) 100 MG capsule Take 1 capsule (100 mg total) by mouth 2 (two) times daily. (Patient taking differently: Take 250 mg by mouth daily. ) 10 capsule 0   dorzolamide (TRUSOPT) 2 % ophthalmic solution Place 1 drop into both eyes  3 (three) times daily.     ferrous sulfate (FERROUSUL) 325 (65 FE) MG tablet Take 1 tablet (325 mg total) by mouth 3 (three) times daily with meals. (Patient taking differently: Take 325 mg by mouth daily. )     fish oil-omega-3 fatty acids 1000 MG capsule Take 1 g by mouth 2 (two) times daily.      Flaxseed, Linseed, (FLAX SEED OIL) 1000 MG CAPS Take 1,000 mg by mouth 2 (two) times daily.     furosemide (LASIX) 20 MG tablet Take 20 mg by mouth daily as needed for fluid or edema.     HYDROcodone-acetaminophen (NORCO) 7.5-325 MG tablet Take 1-2 tablets by mouth every 4 (four) hours as needed for moderate pain or severe pain. (Patient not taking: Reported on 05/28/2017) 60 tablet 0   latanoprost (XALATAN) 0.005 % ophthalmic solution Place 1 drop into the right eye daily. (Patient taking differently: Place 1 drop into both eyes at bedtime. ) 2.5 mL 2   levothyroxine (SYNTHROID, LEVOTHROID) 112 MCG tablet Take 112 mcg by mouth daily before breakfast.     methocarbamol (ROBAXIN) 500 MG tablet Take 1 tablet (500 mg total) by mouth every 6 (six) hours as needed for muscle spasms. (Patient not taking: Reported on 05/28/2017) 40 tablet 0   naproxen sodium (ANAPROX) 220 MG tablet Take 440 mg by mouth 2 (two) times daily as needed (for pain).     polyethylene glycol (MIRALAX / GLYCOLAX) packet  Take 17 g by mouth 2 (two) times daily. (Patient taking differently: Take 17 g by mouth every other day. ) 14 each 0   Probiotic Product (PHILLIPS COLON HEALTH PO) Take 1 capsule by mouth daily.     RaNITidine HCl (ZANTAC PO) Take by mouth.     rivaroxaban (XARELTO) 10 MG TABS tablet Take 1 tablet (10 mg total) by mouth daily. (Patient not taking: Reported on 05/28/2017) 14 tablet 0   SUPER B COMPLEX/C PO Take 1 tablet by mouth daily.     venlafaxine XR (EFFEXOR-XR) 37.5 MG 24 hr capsule Take 1 capsule (37.5 mg total) by mouth daily. 30 capsule 11   No current facility-administered medications on file prior to visit.     Allergies  Allergen Reactions   Simvastatin Other (See Comments)    Bones hurt    Codeine Anxiety    Objective:  General: Alert and oriented x3 in no acute distress  Dermatology: No open lesions bilateral lower extremities, no webspace macerations, no ecchymosis bilateral, all nails x 10 are well manicured.  Vascular: Dorsalis Pedis and Posterior Tibial pedal pulses 1/4, Capillary Fill Time 3 seconds, + pedal hair growth bilateral, no edema bilateral lower extremities, Temperature gradient within normal limits.  Neurology: Johney Maine sensation intact via light touch bilateral.  Musculoskeletal: Mild to moderate tenderness with palpation at insertion of the Achilles on Right>Left, there is calcaneal exostosis with mild soft tissue swelling present and decreased ankle rom with knee extending  vs flexed resembling gastroc equnius bilateral, The achilles tendon feels intact with no nodularity or palpable dell, Thompson sign negative, there is also pain to palpation to the plantar heels at the plantar fascial insertions bilateral, subtalar joint range of motion is within normal limits, however there is limitation at the midtarsal joint.  There is no 1st ray hypermobility or forefoot deformity noted bilateral.  Pes cavus foot type.   Xrays  Right/Left Foot    Impression: Normal osseous mineralization. Joint spaces preserved except at midtarsal joint where there is joint space narrowing supportive of arthritis. No fracture/dislocation/boney destruction. Calcaneal spur present. Kager's triangle intact with no obliteration. No soft tissue abnormalities or radiopaque foreign bodies.   Assessment and Plan: Problem List Items Addressed This Visit   None Visit Diagnoses     Bilateral foot pain    -  Primary   Relevant Orders   DG Foot Complete Right   DG Foot Complete Left   Achilles tendonitis, bilateral       Plantar fasciitis, bilateral       Capsulitis           -Complete examination  performed -Xrays reviewed -Discussed treatement options bilateral foot pain -Dispensed night splint and directed patient on proper use -Rx prednisone to take as directed -Recommend icing 20 minutes at least once daily in the evening for pain and inflammation -No improvement will consider injection/heel lift/MRI/PT/EPAT -Patient to return to office as scheduled in 3 weeks or sooner if condition worsens.  Landis Martins, DPM

## 2021-04-16 NOTE — Patient Instructions (Signed)

## 2021-04-17 ENCOUNTER — Other Ambulatory Visit: Payer: Self-pay | Admitting: Sports Medicine

## 2021-04-17 DIAGNOSIS — M7661 Achilles tendinitis, right leg: Secondary | ICD-10-CM

## 2021-05-09 ENCOUNTER — Ambulatory Visit: Payer: Medicare Other | Admitting: Sports Medicine

## 2021-05-14 ENCOUNTER — Ambulatory Visit: Payer: Medicare Other | Admitting: Sports Medicine

## 2021-05-14 ENCOUNTER — Encounter: Payer: Self-pay | Admitting: Sports Medicine

## 2021-05-14 ENCOUNTER — Other Ambulatory Visit: Payer: Self-pay

## 2021-05-14 DIAGNOSIS — M79671 Pain in right foot: Secondary | ICD-10-CM | POA: Diagnosis not present

## 2021-05-14 DIAGNOSIS — M79672 Pain in left foot: Secondary | ICD-10-CM | POA: Diagnosis not present

## 2021-05-14 DIAGNOSIS — M7662 Achilles tendinitis, left leg: Secondary | ICD-10-CM

## 2021-05-14 DIAGNOSIS — M722 Plantar fascial fibromatosis: Secondary | ICD-10-CM | POA: Diagnosis not present

## 2021-05-14 DIAGNOSIS — M7661 Achilles tendinitis, right leg: Secondary | ICD-10-CM

## 2021-05-14 MED ORDER — TRIAMCINOLONE ACETONIDE 10 MG/ML IJ SUSP
10.0000 mg | Freq: Once | INTRAMUSCULAR | Status: AC
  Administered 2021-05-14: 10 mg

## 2021-05-14 NOTE — Progress Notes (Signed)
Subjective: Megan Burgess is a 75 y.o. female patient who presents to office for follow up evaluation of Right> Left heel pain. Patient reports that pain is the same no improvement with the pain even when she was on steroids dates that pain is 6 out of 10 constant in nature worse with first few steps after resting has tried icing stretching and night splint states that her calfs are also cramping now.  No other pedal complaints noted.  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   History of total right hip replacement 02/09/2017   RBBB 01/16/2017   Controlled substance agreement signed 07/06/2016   Vitamin B12 deficiency 02/10/2016   DDD (degenerative disc disease), lumbar 11/01/2015   Neuropathy 11/01/2015   Acquired hypothyroidism 10/16/2013   Back pain 10/16/2013   Glaucoma 10/16/2013   Hyperlipidemia 10/16/2013   ABNORMAL EKG 04/17/2009   HYPERCALCEMIA 04/12/2009   ANXIETY 06/20/2007   ALLERGIC RHINITIS 06/20/2007   GERD 06/20/2007   OSTEOARTHRITIS 06/20/2007   BREAST CANCER, HX OF 06/20/2007   COLONIC POLYPS, HX OF 06/20/2007    Current Outpatient Medications on File Prior to Visit  Medication Sig Dispense Refill   ALPRAZolam (XANAX) 0.5 MG tablet Take 0.5 mg by mouth 2 (two) times daily.     Calcium Carbonate-Vitamin D (CALCIUM 600+D PO) Take 1 tablet by mouth daily.     Cholecalciferol (VITAMIN D3) 2000 units capsule Take 2,000 Units by mouth daily.     DiphenhydrAMINE HCl, Sleep, (UNISOM SLEEPGELS) 50 MG CAPS Take 50 mg by mouth at bedtime.     docusate sodium (COLACE) 100 MG capsule Take 1 capsule (100 mg total) by mouth 2 (two) times daily. (Patient taking differently: Take 250 mg by mouth daily. ) 10 capsule 0   dorzolamide (TRUSOPT) 2 % ophthalmic solution Place 1 drop into both eyes 3 (three) times daily.     ferrous sulfate (FERROUSUL) 325 (65 FE) MG tablet Take  1 tablet (325 mg total) by mouth 3 (three) times daily with meals. (Patient taking differently: Take 325 mg by mouth daily. )     fish oil-omega-3 fatty acids 1000 MG capsule Take 1 g by mouth 2 (two) times daily.      Flaxseed, Linseed, (FLAX SEED OIL) 1000 MG CAPS Take 1,000 mg by mouth 2 (two) times daily.     furosemide (LASIX) 20 MG tablet Take 20 mg by mouth daily as needed for fluid or edema.     HYDROcodone-acetaminophen (NORCO) 7.5-325 MG tablet Take 1-2 tablets by mouth every 4 (four) hours as needed for moderate pain or severe pain. (Patient not taking: Reported on 05/28/2017) 60 tablet 0   latanoprost (XALATAN) 0.005 % ophthalmic solution Place 1 drop into the right eye daily. (Patient taking differently: Place 1 drop into both eyes at bedtime. ) 2.5 mL 2   levothyroxine (SYNTHROID, LEVOTHROID) 112 MCG tablet Take 112 mcg by mouth daily before breakfast.     methocarbamol (ROBAXIN) 500 MG tablet Take 1 tablet (500 mg total) by mouth every 6 (six) hours as needed for muscle spasms. (Patient not taking: Reported on 05/28/2017) 40 tablet 0   naproxen sodium (ANAPROX) 220 MG tablet Take 440 mg by mouth 2 (two) times daily as needed (for pain).     polyethylene glycol (MIRALAX / GLYCOLAX) packet Take 17 g by mouth 2 (two) times daily. (Patient  taking differently: Take 17 g by mouth every other day. ) 14 each 0   predniSONE (STERAPRED UNI-PAK 21 TAB) 10 MG (21) TBPK tablet Take as directed 21 tablet 0   Probiotic Product (PHILLIPS COLON HEALTH PO) Take 1 capsule by mouth daily.     RaNITidine HCl (ZANTAC PO) Take by mouth.     rivaroxaban (XARELTO) 10 MG TABS tablet Take 1 tablet (10 mg total) by mouth daily. (Patient not taking: Reported on 05/28/2017) 14 tablet 0   SUPER B COMPLEX/C PO Take 1 tablet by mouth daily.     venlafaxine XR (EFFEXOR-XR) 37.5 MG 24 hr capsule Take 1 capsule (37.5 mg total) by mouth daily. 30 capsule 11   No current facility-administered medications on file prior to  visit.    Allergies  Allergen Reactions   Simvastatin Other (See Comments)    Bones hurt    Codeine Anxiety    Objective:  General: Alert and oriented x3 in no acute distress  Dermatology: No open lesions bilateral lower extremities, no webspace macerations, no ecchymosis bilateral, all nails x 10 are well manicured.  Vascular: Dorsalis Pedis and Posterior Tibial pedal pulses 1/4, Capillary Fill Time 3 seconds, + pedal hair growth bilateral, no edema bilateral lower extremities, Temperature gradient within normal limits.  Neurology: Johney Maine sensation intact via light touch bilateral.  Musculoskeletal: Mild to moderate tenderness with palpation at insertion of the Achilles on Right>Left, there is calcaneal exostosis with mild soft tissue swelling present and decreased ankle rom with knee extending  vs flexed resembling gastroc equnius bilateral, The achilles tendon feels intact with no nodularity or palpable dell, Thompson sign negative, there is also pain to palpation to the plantar heels at the plantar fascial insertions bilateral, subtalar joint range of motion is within normal limits, however there is limitation at the midtarsal joint.  There is no 1st ray hypermobility or forefoot deformity noted bilateral.  Pes cavus foot type.  Assessment and Plan: Problem List Items Addressed This Visit   None Visit Diagnoses     Bilateral foot pain    -  Primary   Achilles tendonitis, bilateral       Plantar fasciitis, bilateral            -Complete examination performed -Discussed treatement options bilateral foot pain -After oral consent and aseptic prep, injected a mixture containing 1 ml of 2%  plain lidocaine, 1 ml 0.5% plain marcaine, 0.5 ml of kenalog 10 and 0.5 ml of dexamethasone phosphate into right and left heel at plantar fascial insertions without complication. Post-injection care discussed with patient.  -Dispensed heel lifts for patient to wear as directed in her  shoes -Advised patient to try topical lidocaine patch to the back of the heels which she already has at home -Advised patient to change using her night splint 1 hour before going to bed since she is experiencing cramping in her legs -Encourage patient to continue with gentle active stretching as well -Continue with icing as directed -Patient to return to office as scheduled in 4weeks or sooner if condition worsens.  Landis Martins, DPM

## 2021-06-11 ENCOUNTER — Ambulatory Visit: Payer: Medicare Other | Admitting: Sports Medicine

## 2021-06-27 ENCOUNTER — Ambulatory Visit: Payer: Medicare Other | Admitting: Sports Medicine

## 2023-02-24 ENCOUNTER — Encounter (INDEPENDENT_AMBULATORY_CARE_PROVIDER_SITE_OTHER): Payer: Medicare HMO | Admitting: Podiatry

## 2023-02-24 NOTE — Progress Notes (Signed)
This encounter was created in error - please disregard.  Patient was a no show for scheduled appt today.

## 2023-03-29 ENCOUNTER — Ambulatory Visit (INDEPENDENT_AMBULATORY_CARE_PROVIDER_SITE_OTHER): Payer: Medicare HMO | Admitting: Podiatry

## 2023-03-29 DIAGNOSIS — Z91199 Patient's noncompliance with other medical treatment and regimen due to unspecified reason: Secondary | ICD-10-CM

## 2023-03-29 NOTE — Progress Notes (Signed)
NS, CG

## 2023-04-15 ENCOUNTER — Ambulatory Visit (INDEPENDENT_AMBULATORY_CARE_PROVIDER_SITE_OTHER): Payer: Medicare HMO | Admitting: Podiatry

## 2023-04-15 DIAGNOSIS — L600 Ingrowing nail: Secondary | ICD-10-CM | POA: Diagnosis not present

## 2023-04-15 DIAGNOSIS — M7662 Achilles tendinitis, left leg: Secondary | ICD-10-CM

## 2023-04-15 DIAGNOSIS — M7661 Achilles tendinitis, right leg: Secondary | ICD-10-CM | POA: Diagnosis not present

## 2023-04-15 NOTE — Progress Notes (Unsigned)
P&A left hallux lateral nail border.  Also gets some Achilles tendon pain.  States that she has done everything for the past.  Injections worked pretty well for her.  She would like to get injections approximately 1 month prior to her [place] trip in December.  Refuses physical therapy.  Requested right hallux nail be trimmed back       Subjective:  Patient ID: Megan Burgess, female    DOB: 1946-03-02,  MRN: [id number]  Megan Burgess presents to clinic today for:  Chief Complaint  Patient presents with   Ingrown Toenail    Pt states the last time she went in for a pedicure they cut it on the side and it got irritated and now her left great toe is hurting, she also stayed she wants to talk about what she can do about her bone spur on bilateral feet she says she is going on a trip in December and will be doing lots of walking  .  PCP is Lindwood Qua, MD.  Allergies  Allergen Reactions   Simvastatin Other (See Comments)    Bones hurt    Codeine Anxiety    Review of Systems: Negative except as noted in the HPI.  Objective:  There were no vitals filed for this visit.  Megan Burgess is a pleasant 77 y.o. female in NAD. AAO x 3.  Vascular Examination: Capillary refill time is 3-5 seconds to toes bilateral. Palpable pedal pulses b/l LE. Digital hair present b/l. No pedal edema b/l. Skin temperature gradient WNL b/l. No varicosities b/l. No cyanosis or clubbing noted b/l.   Dermatological Examination: There is incurvation of the *** nail border.  There is pain on palpation of the affected nail border.  ***  Neurological Examination: Protective sensation intact with Semmes-Weinstein 10 gram monofilament b/l LE. Vibratory sensation intact b/l LE.  Musculoskeletal Examination: Muscle strength 5/5 to all LE muscle groups b/l.       No data to display           Assessment/Plan: No diagnosis found.  No orders of the defined types were placed in this  encounter.  None  Discussed patient's condition today.  After obtaining patient consent, the *** toe was anesthetized with a 50:50 mixture of 1% lidocaine plain and 0.5% bupivacaine plain for a total of 3cc's administered.  Upon confirmation of anesthesia, a freer elevator was utilized to free the *** nail border from the nail bed.  The nail border was then avulsed proximal to the eponychium and removed in toto.  The area was inspected for any remaining spicules.  A chemical matrixectomy was performed with phenol and neutralized with alcohol solution.  Antibiotic ointment and a DSD were applied, followed by a Coban dressing.  Patient tolerated the anesthetic and procedure well and will f/u in 2-3 weeks for recheck.  Patient given post-procedure instructions for Epsom salt soaks, antibiotic ointment and daily use of Bandaids until toe starts to dry / form eschar.    No follow-ups on file.   Clerance Lav, DPM, FACFAS [facility]     [address], [place] [address]                Office [phone]  Fax [phone]

## 2023-04-15 NOTE — Patient Instructions (Signed)

## 2023-04-16 ENCOUNTER — Telehealth: Payer: Self-pay | Admitting: Podiatry

## 2023-04-16 NOTE — Telephone Encounter (Signed)
Pt called was seen yesterday had partial nail removed  and got home reading the instructions and it says to put an ointment on it. She was not sure what ointment you were talking about.  Was it just neosporin ointment? Please advise

## 2023-04-16 NOTE — Telephone Encounter (Signed)
Notified pt and she said thank you. ? ?

## 2023-04-28 ENCOUNTER — Ambulatory Visit (INDEPENDENT_AMBULATORY_CARE_PROVIDER_SITE_OTHER): Payer: Medicare HMO | Admitting: Podiatry

## 2023-04-28 DIAGNOSIS — L600 Ingrowing nail: Secondary | ICD-10-CM

## 2023-04-28 NOTE — Progress Notes (Signed)
       Subjective:  Patient ID: Megan Burgess, female    DOB: 1945/10/06,  MRN: [id number]  Chief Complaint  Patient presents with   Follow-up    PNA recheck    Megan Burgess presents to clinic today for f/u of PNA to the left hallux lateral nail border.  Denies any drainage.  She has been performing her Epsom salt soaks daily.  She has no pain at this time and is very pleased.  She still has her appointment scheduled for November or December to get her Achilles tendon area injected with cortisone prior to her trip to [place].  PCP is Lindwood Qua, MD.  Allergies  Allergen Reactions   Simvastatin Other (See Comments)    Bones hurt    Codeine Anxiety   Objective:  There were no vitals filed for this visit.  Vascular Examination: Capillary refill time is 3-5 seconds to toes bilateral. Palpable pedal pulses b/l LE. Digital hair present b/l. No pedal edema b/l. Skin temperature gradient WNL b/l. No varicosities b/l. No cyanosis or clubbing noted b/l.   Dermatological Examination: Upon inspection of the PNA site, there are no clinical signs of infection.  No purulence, no necrosis, no malodor present.  Minimal to no erythema present due to phenol chemical reaction.  Eschar formed along nail margin.  Minimal to no pain on palpation of area.   Assessment/Plan: 1. Ingrown toenail     Patient can discontinue all instructions following the P&A procedure.  Informed patient that the eschar that has formed in the area will fall off on its own and she is free to get into oceans and hot tubs and swimming at this time  Return if symptoms worsen or fail to improve.   Clerance Lav, DPM, FACFAS [facility]     [address] [place], [place] [address]                Office [phone]  Fax [phone]

## 2023-09-01 ENCOUNTER — Ambulatory Visit: Payer: Medicare HMO | Admitting: Podiatry
